# Patient Record
Sex: Female | Born: 1985 | Race: White | Hispanic: No | Smoking: Never smoker
Health system: Southern US, Community
[De-identification: ages and names within clinical notes are randomized; demographics above are authoritative.]

## PROBLEM LIST (undated history)

## (undated) ENCOUNTER — Inpatient Hospital Stay (HOSPITAL_COMMUNITY): Payer: Self-pay

## (undated) DIAGNOSIS — J45909 Unspecified asthma, uncomplicated: Secondary | ICD-10-CM

## (undated) HISTORY — PX: CERVIX SURGERY: SHX593

## (undated) HISTORY — PX: DILATION AND CURETTAGE OF UTERUS: SHX78

## (undated) HISTORY — PX: TUBAL LIGATION: SHX77

---

## 2013-07-23 ENCOUNTER — Encounter (HOSPITAL_COMMUNITY): Payer: Self-pay | Admitting: Emergency Medicine

## 2013-07-23 ENCOUNTER — Emergency Department (HOSPITAL_COMMUNITY): Payer: Medicaid Other

## 2013-07-23 ENCOUNTER — Emergency Department (HOSPITAL_COMMUNITY)
Admission: EM | Admit: 2013-07-23 | Discharge: 2013-07-23 | Disposition: A | Discharge status: Home / Self Care | Payer: Medicaid Other | Attending: Emergency Medicine | Admitting: Emergency Medicine

## 2013-07-23 DIAGNOSIS — O9989 Other specified diseases and conditions complicating pregnancy, childbirth and the puerperium: Secondary | ICD-10-CM | POA: Insufficient documentation

## 2013-07-23 DIAGNOSIS — Z3490 Encounter for supervision of normal pregnancy, unspecified, unspecified trimester: Secondary | ICD-10-CM

## 2013-07-23 DIAGNOSIS — R109 Unspecified abdominal pain: Secondary | ICD-10-CM | POA: Insufficient documentation

## 2013-07-23 DIAGNOSIS — J45909 Unspecified asthma, uncomplicated: Secondary | ICD-10-CM | POA: Insufficient documentation

## 2013-07-23 DIAGNOSIS — R110 Nausea: Secondary | ICD-10-CM | POA: Insufficient documentation

## 2013-07-23 DIAGNOSIS — Z79899 Other long term (current) drug therapy: Secondary | ICD-10-CM | POA: Insufficient documentation

## 2013-07-23 HISTORY — DX: Unspecified asthma, uncomplicated: J45.909

## 2013-07-23 LAB — BASIC METABOLIC PANEL
BUN: 11 mg/dL (ref 6–23)
CHLORIDE: 101 meq/L (ref 96–112)
CO2: 22 mEq/L (ref 19–32)
CREATININE: 0.58 mg/dL (ref 0.50–1.10)
Calcium: 9.5 mg/dL (ref 8.4–10.5)
Glucose, Bld: 94 mg/dL (ref 70–99)
Potassium: 3.7 mEq/L (ref 3.7–5.3)
Sodium: 136 mEq/L — ABNORMAL LOW (ref 137–147)

## 2013-07-23 LAB — POC URINE PREG, ED: Preg Test, Ur: POSITIVE — AB

## 2013-07-23 LAB — CBC WITH DIFFERENTIAL/PLATELET
BASOS PCT: 0 % (ref 0–1)
Basophils Absolute: 0 10*3/uL (ref 0.0–0.1)
EOS ABS: 0.1 10*3/uL (ref 0.0–0.7)
Eosinophils Relative: 1 % (ref 0–5)
HEMATOCRIT: 37.2 % (ref 36.0–46.0)
Hemoglobin: 12.8 g/dL (ref 12.0–15.0)
LYMPHS ABS: 2.7 10*3/uL (ref 0.7–4.0)
Lymphocytes Relative: 29 % (ref 12–46)
MCH: 29.6 pg (ref 26.0–34.0)
MCHC: 34.4 g/dL (ref 30.0–36.0)
MCV: 85.9 fL (ref 78.0–100.0)
MONO ABS: 0.6 10*3/uL (ref 0.1–1.0)
Monocytes Relative: 6 % (ref 3–12)
Neutro Abs: 5.8 10*3/uL (ref 1.7–7.7)
Neutrophils Relative %: 63 % (ref 43–77)
Platelets: 177 10*3/uL (ref 150–400)
RBC: 4.33 MIL/uL (ref 3.87–5.11)
RDW: 12.4 % (ref 11.5–15.5)
WBC: 9.2 10*3/uL (ref 4.0–10.5)

## 2013-07-23 LAB — HCG, QUANTITATIVE, PREGNANCY: hCG, Beta Chain, Quant, S: 45127 m[IU]/mL — ABNORMAL HIGH (ref <5)

## 2013-07-23 MED ORDER — PROMETHAZINE HCL 25 MG PO TABS: 25.0000 mg | ORAL_TABLET | Freq: Four times a day (QID) | ORAL | Status: DC | PRN

## 2013-07-23 MED ORDER — ONDANSETRON 8 MG PO TBDP
8.0000 mg | ORAL_TABLET | Freq: Once | ORAL | Status: AC
  Administered 2013-07-23: 8 mg via ORAL
  Filled 2013-07-23: qty 1

## 2013-07-23 NOTE — ED Provider Notes (Signed)
CSN: [id number]     Arrival date & time 07/23/13  1644 History   First MD Initiated Contact with Patient 07/23/13 1827     Chief Complaint  Patient presents with  . Nausea  . Abdominal Pain     (Consider location/radiation/quality/duration/timing/severity/associated sxs/prior Treatment) HPI..... G6 P3 Ab2 last menstrual period February 17 presents with intermittent abdominal pain for 2 days.  No vaginal discharge or bleeding. No fever, sweats, chills, dysuria. Severity is mild. No radiation of pain. Home pregnancy test positive  Past Medical History  Diagnosis Date  . Asthma    Past Surgical History  Procedure Laterality Date  . Cervix surgery     History reviewed. No pertinent family history. History  Substance Use Topics  . Smoking status: Never Smoker   . Smokeless tobacco: Never Used  . Alcohol Use: Yes     Comment: social   OB History   Grav Para Term Preterm Abortions TAB SAB Ect Mult Living                 Review of Systems  All other systems reviewed and are negative.      Allergies  Sulfate  Home Medications   Current Outpatient Rx  Name  Route  Sig  Dispense  Refill  . albuterol (PROVENTIL HFA;VENTOLIN HFA) 108 (90 BASE) MCG/ACT inhaler   Inhalation   Inhale 1 puff into the lungs every 6 (six) hours as needed for wheezing or shortness of breath.         . Multiple Vitamin (MULTIVITAMIN WITH MINERALS) TABS tablet   Oral   Take 1 tablet by mouth daily.         . promethazine (PHENERGAN) 25 MG tablet   Oral   Take 25 mg by mouth every 6 (six) hours as needed for nausea or vomiting.          BP 100/60  Pulse 69  Temp(Src) 98.1 F (36.7 C) (Oral)  Resp 18  SpO2 99%  LMP 06/04/2013 Physical Exam  Nursing note and vitals reviewed. Constitutional: She is oriented to person, place, and time. She appears well-developed and well-nourished.  HENT:  Head: Normocephalic and atraumatic.  Eyes: Conjunctivae and EOM are normal. Pupils are equal,  round, and reactive to light.  Neck: Normal range of motion. Neck supple.  Cardiovascular: Normal rate, regular rhythm and normal heart sounds.   Pulmonary/Chest: Effort normal and breath sounds normal.  Abdominal: Soft. Bowel sounds are normal.  Nontender  Musculoskeletal: Normal range of motion.  Neurological: She is alert and oriented to person, place, and time.  Skin: Skin is warm and dry.  Psychiatric: She has a normal mood and affect. Her behavior is normal.    ED Course  Procedures (including critical care time) Labs Review Labs Reviewed  BASIC METABOLIC PANEL - Abnormal; Notable for the following:    Sodium 136 (*)    All other components within normal limits  HCG, QUANTITATIVE, PREGNANCY - Abnormal; Notable for the following:    hCG, Beta Chain, Quant, [place] [id number] (*)    All other components within normal limits  POC URINE PREG, ED - Abnormal; Notable for the following:    Preg Test, Ur POSITIVE (*)    All other components within normal limits  CBC WITH DIFFERENTIAL   Imaging Review [place] Ob Comp Less 14 Wks  07/23/2013   CLINICAL DATA:  Pregnant patient with pelvic pain.  EXAM: OBSTETRIC <14 WK US AND TRANSVAGINAL OB US  TECHNIQUE: Both  transabdominal and transvaginal ultrasound examinations were performed for complete evaluation of the gestation as well as the maternal uterus, adnexal regions, and pelvic cul-de-sac. Transvaginal technique was performed to assess early pregnancy.  COMPARISON:  None.  FINDINGS: Intrauterine gestational sac: Visualized/normal in shape.  Yolk sac:  Visualized.  Embryo:  Visualized.  Cardiac Activity: Detected.  Heart Rate:  131 bpm  CRL:   1.0  mm   7 w 0 d                  [place] EDC: 03/11/2014.  Maternal uterus/adnexae: Unremarkable. Tiny amount of fluid about the left adnexa noted.  IMPRESSION: Single living intrauterine pregnancy without complicating feature. No acute abnormality.   Electronically Signed   By: Drusilla Kanner M.D.   On: 07/23/2013 21:03    US Ob Transvaginal  07/23/2013   CLINICAL DATA:  Pregnant patient with pelvic pain.  EXAM: OBSTETRIC <14 WK [place] AND TRANSVAGINAL OB US  TECHNIQUE: Both transabdominal and transvaginal ultrasound examinations were performed for complete evaluation of the gestation as well as the maternal uterus, adnexal regions, and pelvic cul-de-sac. Transvaginal technique was performed to assess early pregnancy.  COMPARISON:  None.  FINDINGS: Intrauterine gestational sac: Visualized/normal in shape.  Yolk sac:  Visualized.  Embryo:  Visualized.  Cardiac Activity: Detected.  Heart Rate:  131 bpm  CRL:   1.0  mm   7 w 0 d                  [place] EDC: 03/11/2014.  Maternal uterus/adnexae: Unremarkable. Tiny amount of fluid about the left adnexa noted.  IMPRESSION: Single living intrauterine pregnancy without complicating feature. No acute abnormality.   Electronically Signed   By: Drusilla Kanner M.D.   On: 07/23/2013 21:03     EKG Interpretation None      MDM   Final diagnoses:  Pregnancy   Ultrasound shows a single living intrauterine uterine pregnancy without complications.  No dates assigned. Results discussed with patient. She has obstetrical followup    Donnetta Hutching, MD 07/23/13 2311

## 2013-07-23 NOTE — Discharge Instructions (Signed)
Ultrasound shows a viable pregnancy. Tylenol for pain. Followup your obstetrician this week.

## 2013-07-23 NOTE — ED Notes (Signed)
Pt sts that she is having abd pain & nausea for several days. Pt describes pain as sharp then dull. Pt also has had some vaginal discharge during this same time. Pt has a cervical surgery in August and was told she would not be able to get pregnant and if so then she would be high risk for miscarriage. Pt thinks she might [redacted] week pregnant due to her LMP was 06/04/2013.

## 2013-07-23 NOTE — ED Notes (Signed)
Pregnant [redacted] weeks/ HX of miscarriages stomach cramping

## 2013-07-23 NOTE — Progress Notes (Signed)
   CARE MANAGEMENT ED NOTE 07/23/2013  Patient:  Celli,Carole   Account Number:  [id number]  Date Initiated:  07/23/2013  Documentation initiated by:  Radford PaxFERRERO,Jaxen Samples  Subjective/Objective Assessment:   Patient presents to ED with abdominla pian n/v     Subjective/Objective Assessment Detail:     Action/Plan:   Action/Plan Detail:   Anticipated DC Date:       Status Recommendation to Physician:   Result of Recommendation:    Other ED Services  Consult Working Plan    DC Planning Services  Other  PCP issues    Choice offered to / List presented to:            Status of service:  Completed, signed off  ED Comments:   ED Comments Detail:  Patient confirms she does not have a pcp.  [facility] of [facility] who [place].  Patient thankful for resources.  No further EDCM needs at this time.

## 2013-07-28 ENCOUNTER — Encounter (HOSPITAL_COMMUNITY): Payer: Self-pay | Admitting: *Deleted

## 2013-07-28 ENCOUNTER — Inpatient Hospital Stay (HOSPITAL_COMMUNITY)
Admission: AD | Admit: 2013-07-28 | Discharge: 2013-07-28 | Disposition: A | Discharge status: Home / Self Care | Payer: Medicaid Other | Source: Ambulatory Visit | Attending: Obstetrics & Gynecology | Admitting: Obstetrics & Gynecology

## 2013-07-28 DIAGNOSIS — O219 Vomiting of pregnancy, unspecified: Secondary | ICD-10-CM

## 2013-07-28 DIAGNOSIS — O210 Mild hyperemesis gravidarum: Secondary | ICD-10-CM | POA: Insufficient documentation

## 2013-07-28 LAB — URINE MICROSCOPIC-ADD ON

## 2013-07-28 LAB — URINALYSIS, ROUTINE W REFLEX MICROSCOPIC
BILIRUBIN URINE: NEGATIVE
GLUCOSE, UA: NEGATIVE mg/dL
Ketones, ur: 15 mg/dL — AB
Leukocytes, UA: NEGATIVE
Nitrite: NEGATIVE
Protein, ur: NEGATIVE mg/dL
SPECIFIC GRAVITY, URINE: 1.02 (ref 1.005–1.030)
UROBILINOGEN UA: 0.2 mg/dL (ref 0.0–1.0)
pH: 6 (ref 5.0–8.0)

## 2013-07-28 MED ORDER — PROMETHAZINE HCL 12.5 MG RE SUPP: 12.5000 mg | Freq: Four times a day (QID) | RECTAL | Status: DC | PRN

## 2013-07-28 MED ORDER — ACETAMINOPHEN 500 MG PO TABS: 1000.0000 mg | ORAL_TABLET | Freq: Once | ORAL | Status: DC

## 2013-07-28 MED ORDER — ONDANSETRON 4 MG PO TBDP
4.0000 mg | ORAL_TABLET | Freq: Once | ORAL | Status: AC
  Administered 2013-07-28: 4 mg via ORAL
  Filled 2013-07-28: qty 1

## 2013-07-28 MED ORDER — DOXYLAMINE SUCCINATE (SLEEP) 25 MG PO TABS: 25.0000 mg | ORAL_TABLET | Freq: Every evening | ORAL | Status: DC | PRN

## 2013-07-28 MED ORDER — PROMETHAZINE HCL 25 MG/ML IJ SOLN
25.0000 mg | Freq: Once | INTRAVENOUS | Status: AC
  Administered 2013-07-28: 25 mg via INTRAVENOUS
  Filled 2013-07-28: qty 1

## 2013-07-28 NOTE — Discharge Instructions (Signed)
Hyperemesis Gravidarum Diet Hyperemesis gravidarum is a severe form of morning sickness. It is characterized by frequent and severe vomiting. It happens during the first trimester of pregnancy. It may be caused by the rapid hormone changes that happen during pregnancy. It is associated with a 5% weight loss of pre-pregnancy weight. The hyperemesis diet may be used to lessen symptoms of nausea and vomiting. EATING GUIDELINES  Eat 5 to 6 small meals daily instead of 3 large meals.  Avoid foods with strong smells.  Avoid drinking 30 minutes before and after meals.  Avoid fried or high-fat foods, such as butter and cream sauces.  Starchy foods are usually well-tolerated, such as cereal, toast, bread, potatoes, pasta, rice, and pretzels.  Eat crackers before you get out of bed in the morning.  Avoid spicy foods.  Ginger may help with nausea. Add  tsp ginger to hot tea or choose ginger tea.  Continue to take your prenatal vitamins as directed by your caregiver. SAMPLE MEAL PLAN Breakfast    cup oatmeal  1 slice toast  1 tsp heart-healthy margarine  1 tsp jelly  1 scrambled egg Midmorning Snack   1 cup low-fat yogurt Lunch   Plain ham sandwich  Carrot or celery sticks  1 small apple  3 graham crackers Midafternoon Snack   Cheese and crackers Dinner  4 oz pork tenderloin  1 small baked potato  1 tsp margarine   cup broccoli   cup grapes Evening Snack  1 cup pudding Document Released: 01/30/2007 Document Revised: 06/27/2011 Document Reviewed: 09/04/2012 ExitCare Patient Information 2014 [place].  Morning Sickness Morning sickness is when you feel sick to your stomach (nauseous) during pregnancy. This nauseous feeling may or may not come with vomiting. It often occurs in the morning but can be a problem any time of day. Morning sickness is most common during the first trimester, but it may continue throughout pregnancy. While morning sickness is  unpleasant, it is usually harmless unless you develop severe and continual vomiting (hyperemesis gravidarum). This condition requires more intense treatment.  CAUSES  The cause of morning sickness is not completely known but seems to be related to normal hormonal changes that occur in pregnancy. RISK FACTORS You are at greater risk if you:  Experienced nausea or vomiting before your pregnancy.  Had morning sickness during a previous pregnancy.  Are pregnant with more than one baby, such as twins. TREATMENT  Do not use any medicines (prescription, over-the-counter, or herbal) for morning sickness without first talking to your health care provider. Your health care provider may prescribe or recommend:  Vitamin B6 supplements.  Anti-nausea medicines.  The herbal medicine ginger. HOME CARE INSTRUCTIONS   Only take over-the-counter or prescription medicines as directed by your health care provider.  Taking multivitamins before getting pregnant can prevent or decrease the severity of morning sickness in most women.   Eat a piece of dry toast or unsalted crackers before getting out of bed in the morning.   Eat five or six small meals a day.   Eat dry and bland foods (rice, baked potato). Foods high in carbohydrates are often helpful.  Do not drink liquids with your meals. Drink liquids between meals.   Avoid greasy, fatty, and spicy foods.   Get someone to cook for you if the smell of any food causes nausea and vomiting.   If you feel nauseous after taking prenatal vitamins, take the vitamins at night or with a snack.  Snack on protein foods (  nuts, yogurt, cheese) between meals if you are hungry.   Eat unsweetened gelatins for desserts.   Wearing an acupressure wristband (worn for sea sickness) may be helpful.   Acupuncture may be helpful.   Do not smoke.   Get a humidifier to keep the air in your house free of odors.   Get plenty of fresh air. SEEK MEDICAL  CARE IF:   Your home remedies are not working, and you need medicine.  You feel dizzy or lightheaded.  You are losing weight. SEEK IMMEDIATE MEDICAL CARE IF:   You have persistent and uncontrolled nausea and vomiting.  You pass out (faint). Document Released: 05/26/2006 Document Revised: 12/05/2012 Document Reviewed: 09/19/2012 [facility] Patient Information 2014 [place], [place]. Prenatal Care [facility]    [facility]  [phone]     Phone: [phone]          [facility]                      [facility]  @[place]     Phone: [phone]  Phone: [phone]         [facility]     Phone: [phone]  Phone: [phone]           [facility] [place]                hone: [phone]  Phone: [phone]         [facility] [address] Francoise Ceo      Phone: [phone]  Phone: [phone]         [facility] Dept.                Phone: [phone]  [facility]   [address] [place])          Phone: [phone] [facility]   Phone: [phone]

## 2013-07-28 NOTE — MAU Note (Signed)
Patient presents with complaints of N/V/D X 4 days.

## 2013-07-28 NOTE — MAU Provider Note (Signed)
Chief Complaint: Emesis, Nausea and Diarrhea   First Provider Initiated Contact with Patient 07/28/13 1423     SUBJECTIVE HPI: Brandi Ayala is a 28 y.o. [id number] at [redacted]w[redacted]d who presents with onset N/V 1 wk ago and retained no food or fluids over past 24 hrs. She was seen at [facility] ED 5 days ago and had ultrasound showing a viable [redacted] week gestation, normal CBC and electrolytes. Was given Phenergan but unable to retain pills. Feels weak and tingly.    Past Medical History  Diagnosis Date  . Asthma    OB History  Gravida Para Term Preterm AB SAB TAB Ectopic Multiple Living  7 3   3 2 1   3     # Outcome Date GA Lbr Len/2nd Weight Sex Delivery Anes PTL Lv  7 CUR           6 TAB           5 SAB           4 SAB           3 PAR           2 PAR           1 PAR              Past Surgical History  Procedure Laterality Date  . Cervix surgery     History   Social History  . Marital Status: Single    Spouse Name: N/A    Number of Children: N/A  . Years of Education: N/A   Occupational History  . Not on file.   Social History Main Topics  . Smoking status: Never Smoker   . Smokeless tobacco: Never Used  . Alcohol Use: Yes     Comment: social  . Drug Use: No  . Sexual Activity: Yes    Birth Control/ Protection: None   Other Topics Concern  . Not on file   Social History Narrative  . No narrative on file   No current facility-administered medications on file prior to encounter.   Current Outpatient Prescriptions on File Prior to Encounter  Medication Sig Dispense Refill  . albuterol (PROVENTIL HFA;VENTOLIN HFA) 108 (90 BASE) MCG/ACT inhaler Inhale 1 puff into the lungs every 6 (six) hours as needed for wheezing or shortness of breath.      . Multiple Vitamin (MULTIVITAMIN WITH MINERALS) TABS tablet Take 1 tablet by mouth daily.      . promethazine (PHENERGAN) 25 MG tablet Take 25 mg by mouth every 6 (six) hours as needed for nausea or vomiting.      . promethazine  (PHENERGAN) 25 MG tablet Take 1 tablet (25 mg total) by mouth every 6 (six) hours as needed for nausea.  20 tablet  0   Allergies  Allergen Reactions  . Sulfate Swelling and Rash    ROS: Pertinent items in HPI  OBJECTIVE Blood pressure 90/49, pulse 56, temperature 98 F (36.7 C), temperature source Oral, resp. rate 20, height 5\' 6"  (1.676 m), weight 68.947 kg (152 lb), last menstrual period 06/04/2013. GENERAL: Well-developed, well-nourished female in no acute distress; appears listless HEENT: Normocephalic HEART: normal rate RESP: normal effort ABDOMEN: Soft, non-tender EXTREMITIES: Nontender, no edema NEURO: Alert and oriented  LAB RESULTS No results found for this or any previous visit (from the past 24 hour(s)).  IMAGING US Ob Comp Less 14 Wks  07/23/2013   CLINICAL DATA:  Pregnant patient with pelvic pain.  EXAM:  OBSTETRIC <14 WK US AND TRANSVAGINAL OB US  TECHNIQUE: Both transabdominal and transvaginal ultrasound examinations were performed for complete evaluation of the gestation as well as the maternal uterus, adnexal regions, and pelvic cul-de-sac. Transvaginal technique was performed to assess early pregnancy.  COMPARISON:  None.  FINDINGS: Intrauterine gestational sac: Visualized/normal in shape.  Yolk sac:  Visualized.  Embryo:  Visualized.  Cardiac Activity: Detected.  Heart Rate:  131 bpm  CRL:   1.0  mm   7 w 0 d                  US EDC: 03/11/2014.  Maternal uterus/adnexae: Unremarkable. Tiny amount of fluid about the left adnexa noted.  IMPRESSION: Single living intrauterine pregnancy without complicating feature. No acute abnormality.   Electronically Signed   By: Drusilla Kannerhomas  Dalessio M.D.   On: 07/23/2013 21:03   [place] Ob Transvaginal  07/23/2013   CLINICAL DATA:  Pregnant patient with pelvic pain.  EXAM: OBSTETRIC <14 WK US AND TRANSVAGINAL OB US  TECHNIQUE: Both transabdominal and transvaginal ultrasound examinations were performed for complete evaluation of the gestation as  well as the maternal uterus, adnexal regions, and pelvic cul-de-sac. Transvaginal technique was performed to assess early pregnancy.  COMPARISON:  None.  FINDINGS: Intrauterine gestational sac: Visualized/normal in shape.  Yolk sac:  Visualized.  Embryo:  Visualized.  Cardiac Activity: Detected.  Heart Rate:  131 bpm  CRL:   1.0  mm   7 w 0 d                  US EDC: 03/11/2014.  Maternal uterus/adnexae: Unremarkable. Tiny amount of fluid about the left adnexa noted.  IMPRESSION: Single living intrauterine pregnancy without complicating feature. No acute abnormality.   Electronically Signed   By: Drusilla Kannerhomas  Dalessio M.D.   On: 07/23/2013 21:03    MAU COURSE IV LR with Phenergan 25 mg; 1620: IV infusing and tolerating sips of water but still unable to void. 1710: Feeling better and retaining fluids  ASSESSMENT 1. Nausea and vomiting in pregnancy prior to [redacted] weeks gestation   [id number] at 5839w5d  PLAN Discharge home AVS instructions on N/V in pregnancy   Medication List         albuterol 108 (90 BASE) MCG/ACT inhaler  Commonly known as:  PROVENTIL HFA;VENTOLIN HFA  Inhale 1 puff into the lungs every 6 (six) hours as needed for wheezing or shortness of breath.     bisacodyl 5 MG EC tablet  Commonly known as:  DULCOLAX  Take 5 mg by mouth once.     doxylamine (Sleep) 25 MG tablet  Commonly known as:  UNISOM  Take 1 tablet (25 mg total) by mouth at bedtime as needed.     multivitamin with minerals Tabs tablet  Take 1 tablet by mouth daily.     promethazine 25 MG tablet  Commonly known as:  PHENERGAN  Take 25 mg by mouth every 6 (six) hours as needed for nausea or vomiting.     promethazine 12.5 MG suppository  Commonly known as:  PHENERGAN  Place 1 suppository (12.5 mg total) rectally every 6 (six) hours as needed for nausea or vomiting.       Follow-up Information   [facility] [place]   Contact information:   [address] [place]  [phone]      Please follow up. (See list of prenatal care providers)       Schedule  an appointment as soon as possible for a visit to follow up.      Schedule an appointment as soon as possible for a visit with [facility].   Contact information:   [address]  [address] [place] [address] [phone]        Danae Orleans, CNM 07/28/2013  2:24 PM

## 2013-08-06 ENCOUNTER — Inpatient Hospital Stay (HOSPITAL_COMMUNITY): Payer: Medicaid Other

## 2013-08-06 ENCOUNTER — Encounter (HOSPITAL_COMMUNITY): Payer: Self-pay | Admitting: General Practice

## 2013-08-06 ENCOUNTER — Inpatient Hospital Stay (HOSPITAL_COMMUNITY)
Admission: AD | Admit: 2013-08-06 | Discharge: 2013-08-06 | Disposition: A | Discharge status: Home / Self Care | Payer: Medicaid Other | Source: Ambulatory Visit | Attending: Obstetrics & Gynecology | Admitting: Obstetrics & Gynecology

## 2013-08-06 DIAGNOSIS — O209 Hemorrhage in early pregnancy, unspecified: Secondary | ICD-10-CM | POA: Insufficient documentation

## 2013-08-06 DIAGNOSIS — N949 Unspecified condition associated with female genital organs and menstrual cycle: Secondary | ICD-10-CM | POA: Insufficient documentation

## 2013-08-06 DIAGNOSIS — R109 Unspecified abdominal pain: Secondary | ICD-10-CM | POA: Insufficient documentation

## 2013-08-06 DIAGNOSIS — O4690 Antepartum hemorrhage, unspecified, unspecified trimester: Secondary | ICD-10-CM

## 2013-08-06 DIAGNOSIS — O418X10 Other specified disorders of amniotic fluid and membranes, first trimester, not applicable or unspecified: Secondary | ICD-10-CM

## 2013-08-06 DIAGNOSIS — O468X1 Other antepartum hemorrhage, first trimester: Secondary | ICD-10-CM

## 2013-08-06 DIAGNOSIS — O208 Other hemorrhage in early pregnancy: Secondary | ICD-10-CM

## 2013-08-06 LAB — URINE MICROSCOPIC-ADD ON

## 2013-08-06 LAB — WET PREP, GENITAL
CLUE CELLS WET PREP: NONE SEEN
Trich, Wet Prep: NONE SEEN
Yeast Wet Prep HPF POC: NONE SEEN

## 2013-08-06 LAB — CBC
HCT: 35.6 % — ABNORMAL LOW (ref 36.0–46.0)
HEMOGLOBIN: 12.5 g/dL (ref 12.0–15.0)
MCH: 29.9 pg (ref 26.0–34.0)
MCHC: 35.1 g/dL (ref 30.0–36.0)
MCV: 85.2 fL (ref 78.0–100.0)
Platelets: 161 10*3/uL (ref 150–400)
RBC: 4.18 MIL/uL (ref 3.87–5.11)
RDW: 12.3 % (ref 11.5–15.5)
WBC: 7.2 10*3/uL (ref 4.0–10.5)

## 2013-08-06 LAB — ABO/RH: ABO/RH(D): B POS

## 2013-08-06 LAB — URINALYSIS, ROUTINE W REFLEX MICROSCOPIC
BILIRUBIN URINE: NEGATIVE
Glucose, UA: NEGATIVE mg/dL
Ketones, ur: NEGATIVE mg/dL
LEUKOCYTES UA: NEGATIVE
NITRITE: NEGATIVE
PH: 8 (ref 5.0–8.0)
PROTEIN: NEGATIVE mg/dL
Specific Gravity, Urine: 1.015 (ref 1.005–1.030)
Urobilinogen, UA: 0.2 mg/dL (ref 0.0–1.0)

## 2013-08-06 NOTE — Discharge Instructions (Signed)
Pelvic Rest Pelvic rest is sometimes recommended for women when:   The placenta is partially or completely covering the opening of the cervix (placenta previa).  There is bleeding between the uterine wall and the amniotic sac in the first trimester (subchorionic hemorrhage).  The cervix begins to open without labor starting (incompetent cervix, cervical insufficiency).  The labor is too early (preterm labor). HOME CARE INSTRUCTIONS  Do not have sexual intercourse, stimulation, or an orgasm.  Do not use tampons, douche, or put anything in the vagina.  Do not lift anything over 10 pounds (4.5 kg).  Avoid strenuous activity or straining your pelvic muscles. SEEK MEDICAL CARE IF:  You have any vaginal bleeding during pregnancy. Treat this as a potential emergency.  You have cramping pain felt low in the stomach (stronger than menstrual cramps).  You notice vaginal discharge (watery, mucus, or bloody).  You have a low, dull backache.  There are regular contractions or uterine tightening. SEEK IMMEDIATE MEDICAL CARE IF: You have vaginal bleeding and have placenta previa.  Document Released: 07/30/2010 Document Revised: 06/27/2011 Document Reviewed: 07/30/2010 [facility] Patient Information 2014 [place], [place].  Vaginal Bleeding During Pregnancy A small amount of bleeding from the vagina can happen anytime during pregnancy. It usually stops on its own. However, some bleeding can be serious. Be sure to tell your doctor about all vaginal bleeding. HOME CARE  Get plenty of rest and sleep.  Stay in bed and only get up to go to the bathroom as told by your doctor.  Write down the number of pads you use each day. Note how soaked they are.  Do not use tampons. Do not clean the vagina with a stream of water (douche).  Do not have sex (intercourse) or put anything into your vagina. Have this approved by your doctor.  Save any tissue that comes from your vagina. Show it to your  doctor.  Only take medicine as told by your doctor.  Follow your doctor's advice about lifting, driving, and physical activity. GET HELP RIGHT AWAY IF:   You feel your baby move less or not at all.  You pass out (faint) while going to the bathroom.  You have more bleeding.  You start to have contractions.  You have severe cramps in your stomach, back, or belly (abdomen).  You are leaking fluid or have a gush of fluid from your vagina.  You become lightheaded or weak.  You have chills.  You have clumps of tissue or blood clots coming from your vagina.  You have a fever. MAKE SURE YOU:   Understand these instructions.  Will watch your condition.  Will get help right away if you are not doing well or get worse. Document Released: 01/12/2008 Document Revised: 03/21/2012 Document Reviewed: 01/23/2012 [facility] Patient Information 2014 [place], [place].

## 2013-08-06 NOTE — MAU Provider Note (Signed)
History     CSN: [id number]  Arrival date and time: 08/06/13 1535   First Provider Initiated Contact with Patient 08/06/13 1613      Chief Complaint  Patient presents with  . Vaginal Bleeding  . Abdominal Pain   HPI  Brandi Ayala is a 28 y.o. [id number] [redacted]w[redacted]d presenting for vaginal bleeding since 13:00 today. Patient reports that she was taking a nap at noon and woke up with feeling urinary urgency. She admits that she had some incontinence but became concerned that she saw blood in her urine. However, she went to school and began to have crampy abdominal/pelvic pain. When she went home, she noticed that she continued to bleed and decided to wear a pad. She continued to have crampy abdominal pain but then started having sharper intermittent pains. She presents to the MAU for evaluation of her pregnancy. ROS is as below. Of note, the patient has not had intercourse in the past month. She has had vaginal discharge x1 week which she describes as her "mucous plug".  OB History   Grav Para Term Preterm Abortions TAB SAB Ect Mult Living   7 3 3  0 3 1 2  0 0 3      Past Medical History  Diagnosis Date  . Asthma     Past Surgical History  Procedure Laterality Date  . Cervix surgery      History reviewed. No pertinent family history.  History  Substance Use Topics  . Smoking status: Never Smoker   . Smokeless tobacco: Never Used  . Alcohol Use: Yes     Comment: social    Allergies:  Allergies  Allergen Reactions  . Other Rash    Patient states that she is allergic to jalapeno peppers.  . Sulfa Antibiotics Swelling and Rash    Prescriptions prior to admission  Medication Sig Dispense Refill  . albuterol (PROVENTIL HFA;VENTOLIN HFA) 108 (90 BASE) MCG/ACT inhaler Inhale 1 puff into the lungs every 6 (six) hours as needed for wheezing or shortness of breath.      . Multiple Vitamin (MULTIVITAMIN WITH MINERALS) TABS tablet Take 1 tablet by mouth daily.      . promethazine  (PHENERGAN) 25 MG tablet Take 25 mg by mouth every 6 (six) hours as needed for nausea or vomiting.       US Ob Transvaginal  08/06/2013   CLINICAL DATA:  Pregnant patient with new onset vaginal bleeding.  EXAM: TRANSVAGINAL OB ULTRASOUND  TECHNIQUE: Transvaginal ultrasound was performed for complete evaluation of the gestation as well as the maternal uterus, adnexal regions, and pelvic cul-de-sac.  COMPARISON:  Ob ultrasound 07/23/2013.  FINDINGS: Intrauterine gestational sac: Visualized/normal in shape. A new, moderate subchorionic hemorrhage is identified measuring approximately 3.8 x 2.1 x 1.1 cm.  Yolk sac:  Visualized.  Embryo:  Visualized.  Cardiac Activity: Detected.  Heart Rate: 170 bpm  CRL:   2.59  mm   9 w 3 d                  [place] EDC: 03/08/2014  Maternal uterus/adnexae: Unremarkable.  IMPRESSION: Single living intrauterine pregnancy with a new moderate subchorionic hemorrhage.   Electronically Signed   By: Drusilla Kanner M.D.   On: 08/06/2013 17:40   Results for orders placed during the hospital encounter of 08/06/13 (from the past 48 hour(s))  URINALYSIS, ROUTINE W REFLEX MICROSCOPIC     Status: Abnormal   Collection Time    08/06/13  3:50 PM  Result Value Ref Range   Color, Urine YELLOW  YELLOW   APPearance CLOUDY (*) CLEAR   Specific Gravity, Urine 1.015  1.005 - 1.030   pH 8.0  5.0 - 8.0   Glucose, UA NEGATIVE  NEGATIVE mg/dL   Hgb urine dipstick SMALL (*) NEGATIVE   Bilirubin Urine NEGATIVE  NEGATIVE   Ketones, ur NEGATIVE  NEGATIVE mg/dL   Protein, ur NEGATIVE  NEGATIVE mg/dL   Urobilinogen, UA 0.2  0.0 - 1.0 mg/dL   Nitrite NEGATIVE  NEGATIVE   Leukocytes, UA NEGATIVE  NEGATIVE  URINE MICROSCOPIC-ADD ON     Status: Abnormal   Collection Time    08/06/13  3:50 PM      Result Value Ref Range   Squamous Epithelial / LPF FEW (*) RARE   WBC, UA 0-2  <3 WBC/hpf   Urine-Other AMORPHOUS URATES/PHOSPHATES    WET PREP, GENITAL     Status: Abnormal   Collection Time     08/06/13  4:32 PM      Result Value Ref Range   Yeast Wet Prep HPF POC NONE SEEN  NONE SEEN   Trich, Wet Prep NONE SEEN  NONE SEEN   Clue Cells Wet Prep HPF POC NONE SEEN  NONE SEEN   WBC, Wet Prep HPF POC FEW (*) NONE SEEN   Comment: MANY BACTERIA SEEN  CBC     Status: Abnormal   Collection Time    08/06/13  4:38 PM      Result Value Ref Range   WBC 7.2  4.0 - 10.5 K/uL   RBC 4.18  3.87 - 5.11 MIL/uL   Hemoglobin 12.5  12.0 - 15.0 g/dL   HCT 16.135.6 (*) 09.636.0 - 04.546.0 %   MCV 85.2  78.0 - 100.0 fL   MCH 29.9  26.0 - 34.0 pg   MCHC 35.1  30.0 - 36.0 g/dL   RDW 40.912.3  81.111.5 - 91.415.5 %   Platelets 161  150 - 400 K/uL  ABO/RH     Status: None   Collection Time    08/06/13  4:38 PM      Result Value Ref Range   ABO/RH(D) B POS     [place] Ob Transvaginal  08/06/2013   CLINICAL DATA:  Pregnant patient with new onset vaginal bleeding.  EXAM: TRANSVAGINAL OB ULTRASOUND  TECHNIQUE: Transvaginal ultrasound was performed for complete evaluation of the gestation as well as the maternal uterus, adnexal regions, and pelvic cul-de-sac.  COMPARISON:  Ob ultrasound 07/23/2013.  FINDINGS: Intrauterine gestational sac: Visualized/normal in shape. A new, moderate subchorionic hemorrhage is identified measuring approximately 3.8 x 2.1 x 1.1 cm.  Yolk sac:  Visualized.  Embryo:  Visualized.  Cardiac Activity: Detected.  Heart Rate: 170 bpm  CRL:   2.59  mm   9 w 3 d                  [place] EDC: 03/08/2014  Maternal uterus/adnexae: Unremarkable.  IMPRESSION: Single living intrauterine pregnancy with a new moderate subchorionic hemorrhage.   Electronically Signed   By: Drusilla Kannerhomas  Dalessio M.D.   On: 08/06/2013 17:40    Review of Systems  Constitutional: Negative for fever and chills.  HENT: Negative for hearing loss and sore throat.   Eyes: Negative for blurred vision.  Respiratory: Negative for cough and shortness of breath.   Cardiovascular: Negative for chest pain and leg swelling.  Gastrointestinal: Positive for nausea  (relieved with Phenergan), abdominal pain and diarrhea (x1 last  night). Negative for vomiting, constipation and blood in stool.  Genitourinary: Positive for urgency (x1 earlier today), hematuria (x1 in the afternoon) and flank pain (right-sided). Negative for dysuria and frequency.       Vaginal bleeding/spotting since 13:00  Musculoskeletal: Negative for joint pain.  Skin: Negative for rash.  Neurological: Negative for dizziness and headaches.  Endo/Heme/Allergies: Does not bruise/bleed easily.   Physical Exam   Blood pressure 103/71, pulse 86, temperature 98.5 F (36.9 C), temperature source Oral, resp. rate 16, height 5\' 4"  (1.626 m), weight 68.675 kg (151 lb 6.4 oz), last menstrual period 06/04/2013, SpO2 100.00%.  Physical Exam  Constitutional: She is oriented to person, place, and time. She appears well-developed and well-nourished. No distress.  HENT:  Head: Normocephalic and atraumatic.  Eyes: EOM are normal. Pupils are equal, round, and reactive to light. No scleral icterus.  Neck: Normal range of motion. No tracheal deviation present.  Cardiovascular: Normal rate, regular rhythm, normal heart sounds and intact distal pulses.  Exam reveals no gallop and no friction rub.   No murmur heard. Respiratory: Effort normal and breath sounds normal. No stridor. No respiratory distress. She has no wheezes. She has no rales.  GI: Soft. Bowel sounds are normal. She exhibits no distension. There is tenderness (mild left lower quadrant tenderness). There is no rebound and no guarding.  Genitourinary:    There is no rash, tenderness or lesion on the right labia. There is lesion (nevus) on the left labia. There is no rash or tenderness on the left labia. Cervix exhibits discharge (mixture of discharge and blood but no active hemorrhage). Cervix exhibits no motion tenderness and no friability. Right adnexum displays no mass, no tenderness and no fullness. Left adnexum displays no mass, no  tenderness and no fullness. No erythema, tenderness or bleeding around the vagina. No foreign body around the vagina. No signs of injury around the vagina. Vaginal discharge (mild amount of brown discharge externally) found.  Musculoskeletal: Normal range of motion. She exhibits no edema and no tenderness.  Neurological: She is alert and oriented to person, place, and time.  Skin: Skin is warm and dry. No rash noted. She is not diaphoretic. No erythema.  Psychiatric: She has a normal mood and affect.    MAU Course  Procedures  Results for orders placed during the hospital encounter of 08/06/13 (from the past 24 hour(s))  URINALYSIS, ROUTINE W REFLEX MICROSCOPIC     Status: Abnormal   Collection Time    08/06/13  3:50 PM      Result Value Ref Range   Color, Urine YELLOW  YELLOW   APPearance CLOUDY (*) CLEAR   Specific Gravity, Urine 1.015  1.005 - 1.030   pH 8.0  5.0 - 8.0   Glucose, UA NEGATIVE  NEGATIVE mg/dL   Hgb urine dipstick SMALL (*) NEGATIVE   Bilirubin Urine NEGATIVE  NEGATIVE   Ketones, ur NEGATIVE  NEGATIVE mg/dL   Protein, ur NEGATIVE  NEGATIVE mg/dL   Urobilinogen, UA 0.2  0.0 - 1.0 mg/dL   Nitrite NEGATIVE  NEGATIVE   Leukocytes, UA NEGATIVE  NEGATIVE  URINE MICROSCOPIC-ADD ON     Status: Abnormal   Collection Time    08/06/13  3:50 PM      Result Value Ref Range   Squamous Epithelial / LPF FEW (*) RARE   WBC, UA 0-2  <3 WBC/hpf   Urine-Other AMORPHOUS URATES/PHOSPHATES    WET PREP, GENITAL     Status: Abnormal  Collection Time    08/06/13  4:32 PM      Result Value Ref Range   Yeast Wet Prep HPF POC NONE SEEN  NONE SEEN   Trich, Wet Prep NONE SEEN  NONE SEEN   Clue Cells Wet Prep HPF POC NONE SEEN  NONE SEEN   WBC, Wet Prep HPF POC FEW (*) NONE SEEN  CBC     Status: Abnormal   Collection Time    08/06/13  4:38 PM      Result Value Ref Range   WBC 7.2  4.0 - 10.5 K/uL   RBC 4.18  3.87 - 5.11 MIL/uL   Hemoglobin 12.5  12.0 - 15.0 g/dL   HCT 16.135.6 (*) 09.636.0  - 46.0 %   MCV 85.2  78.0 - 100.0 fL   MCH 29.9  26.0 - 34.0 pg   MCHC 35.1  30.0 - 36.0 g/dL   RDW 04.512.3  40.911.5 - 81.115.5 %   Platelets 161  150 - 400 K/uL   MDM Brandi Ayala is a 28 y.o. [id number] presenting for vaginal bleeding and abdominal pain since 13:00 today. Must rule out fetal demise, consider STI.  transvaginal US - CBC, UA, ABO/Rh, GC Chlamydia, wet mount B positive blood type    Assessment and Plan   A: 7183w3d IUP on US  Vaginal bleeding in first trimester  Moderate subchorionic hemorrhage   P:  Discharge home in stable condition Pelvic rest Bleeding precautions [facility]; encouraged to keep this patient Return to MAU as needed, if symptoms worsen   Wallis BambergMario Mani 08/06/2013, 4:28 PM   Evaluation and management procedures were performed by the PA student under my supervision and collaboration. I have reviewed the note and chart, and I agree with the management and plan.  Iona HansenJennifer Irene Rasch, NP 08/06/2013 6:58 PM

## 2013-08-06 NOTE — MAU Note (Signed)
Patient states she has started spotting and having cramping. Has nausea, no vomiting and had diarrhea yesterday, not today.

## 2013-08-07 LAB — GC/CHLAMYDIA PROBE AMP
CT Probe RNA: NEGATIVE
GC PROBE AMP APTIMA: NEGATIVE

## 2013-08-08 NOTE — MAU Provider Note (Signed)
Attestation of Attending Supervision of Advanced Practitioner (PA/CNM/NP): Evaluation and management procedures were performed by the Advanced Practitioner under my supervision and collaboration.  I have reviewed the Advanced Practitioner's note and chart, and I agree with the management and plan.  Izick Gasbarro, MD, FACOG Attending Obstetrician & Gynecologist Faculty Practice, [facility]  

## 2013-08-23 LAB — OB RESULTS CONSOLE RPR: RPR: NONREACTIVE

## 2013-08-23 LAB — OB RESULTS CONSOLE ANTIBODY SCREEN: Antibody Screen: NEGATIVE

## 2013-08-23 LAB — OB RESULTS CONSOLE HEPATITIS B SURFACE ANTIGEN: HEP B S AG: NEGATIVE

## 2013-08-23 LAB — OB RESULTS CONSOLE GC/CHLAMYDIA
CHLAMYDIA, DNA PROBE: NEGATIVE
GC PROBE AMP, GENITAL: NEGATIVE

## 2013-08-23 LAB — OB RESULTS CONSOLE HIV ANTIBODY (ROUTINE TESTING): HIV: NONREACTIVE

## 2013-08-23 LAB — OB RESULTS CONSOLE RUBELLA ANTIBODY, IGM: Rubella: IMMUNE

## 2014-02-10 ENCOUNTER — Encounter (HOSPITAL_COMMUNITY): Payer: Self-pay | Admitting: *Deleted

## 2014-02-10 ENCOUNTER — Inpatient Hospital Stay (HOSPITAL_COMMUNITY)
Admission: AD | Admit: 2014-02-10 | Discharge: 2014-02-10 | Disposition: A | Discharge status: Home / Self Care | Payer: Medicaid Other | Source: Ambulatory Visit | Attending: Obstetrics and Gynecology | Admitting: Obstetrics and Gynecology

## 2014-02-10 DIAGNOSIS — O9989 Other specified diseases and conditions complicating pregnancy, childbirth and the puerperium: Secondary | ICD-10-CM | POA: Insufficient documentation

## 2014-02-10 DIAGNOSIS — Z3A36 36 weeks gestation of pregnancy: Secondary | ICD-10-CM | POA: Diagnosis not present

## 2014-02-10 DIAGNOSIS — N949 Unspecified condition associated with female genital organs and menstrual cycle: Secondary | ICD-10-CM | POA: Insufficient documentation

## 2014-02-10 DIAGNOSIS — O26893 Other specified pregnancy related conditions, third trimester: Secondary | ICD-10-CM

## 2014-02-10 DIAGNOSIS — O479 False labor, unspecified: Secondary | ICD-10-CM

## 2014-02-10 DIAGNOSIS — R102 Pelvic and perineal pain: Secondary | ICD-10-CM

## 2014-02-10 DIAGNOSIS — Z3A35 35 weeks gestation of pregnancy: Secondary | ICD-10-CM

## 2014-02-10 NOTE — MAU Note (Signed)
Brandi BourgeoisMarie Williams CNM at bedside to perform ultrasound and confirm vertex presentation.

## 2014-02-10 NOTE — MAU Note (Signed)
Urine in lab 

## 2014-02-10 NOTE — Discharge Instructions (Signed)
Braxton Hicks Contractions °Contractions of the uterus can occur throughout pregnancy. Contractions are not always a sign that you are in labor.  °WHAT ARE BRAXTON HICKS CONTRACTIONS?  °Contractions that occur before labor are called Braxton Hicks contractions, or false labor. Toward the end of pregnancy (32-34 weeks), these contractions can develop more often and may become more forceful. This is not true labor because these contractions do not result in opening (dilatation) and thinning of the cervix. They are sometimes difficult to tell apart from true labor because these contractions can be forceful and people have different pain tolerances. You should not feel embarrassed if you go to the hospital with false labor. Sometimes, the only way to tell if you are in true labor is for your health care provider to look for changes in the cervix. °If there are no prenatal problems or other health problems associated with the pregnancy, it is completely safe to be sent home with false labor and await the onset of true labor. °HOW CAN YOU TELL THE DIFFERENCE BETWEEN TRUE AND FALSE LABOR? °False Labor °· The contractions of false labor are usually shorter and not as hard as those of true labor.   °· The contractions are usually irregular.   °· The contractions are often felt in the front of the lower abdomen and in the groin.   °· The contractions may go away when you walk around or change positions while lying down.   °· The contractions get weaker and are shorter lasting as time goes on.   °· The contractions do not usually become progressively stronger, regular, and closer together as with true labor.   °True Labor °· Contractions in true labor last 30-70 seconds, become very regular, usually become more intense, and increase in frequency.   °· The contractions do not go away with walking.   °· The discomfort is usually felt in the top of the uterus and spreads to the lower abdomen and low back.   °· True labor can be  determined by your health care provider with an exam. This will show that the cervix is dilating and getting thinner.   °WHAT TO REMEMBER °· Keep up with your usual exercises and follow other instructions given by your health care provider.   °· Take medicines as directed by your health care provider.   °· Keep your regular prenatal appointments.   °· Eat and drink lightly if you think you are going into labor.   °· If Braxton Hicks contractions are making you uncomfortable:   °¨ Change your position from lying down or resting to walking, or from walking to resting.   °¨ Sit and rest in a tub of warm water.   °¨ Drink 2-3 glasses of water. Dehydration may cause these contractions.   °¨ Do slow and deep breathing several times an hour.   °WHEN SHOULD I SEEK IMMEDIATE MEDICAL CARE? °Seek immediate medical care if: °· Your contractions become stronger, more regular, and closer together.   °· You have fluid leaking or gushing from your vagina.   °· You have a fever.   °· You pass blood-tinged mucus.   °· You have vaginal bleeding.   °· You have continuous abdominal pain.   °· You have low back pain that you never had before.   °· You feel your baby's head pushing down and causing pelvic pressure.   °· Your baby is not moving as much as it used to.   °Document Released: 04/04/2005 Document Revised: 04/09/2013 Document Reviewed: 01/14/2013 °ExitCare® Patient Information ©2015 [employer]. This information is not intended to replace advice given to you by your health care   provider. Make sure you discuss any questions you have with your health care provider. Vaginal Delivery During delivery, your health care provider will help you give birth to your baby. During a vaginal delivery, you will work to push the baby out of your vagina. However, before you can push your baby out, a few things need to happen. The opening of your uterus (cervix) has to soften, thin out, and open up (dilate) all the way to 10 cm. Also, your  baby has to move down from the uterus into your vagina.  SIGNS OF LABOR  Your health care provider will first need to make sure you are in labor. Signs of labor include:   Passing what is called the mucous plug before labor begins. This is a small amount of blood-stained mucus.  Having regular, painful uterine contractions.   The time between contractions gets shorter.   The discomfort and pain gradually get more intense.  Contraction pains get worse when walking and do not go away when resting.   Your cervix becomes thinner (effacement) and dilates. BEFORE THE DELIVERY Once you are in labor and admitted into the hospital or care center, your health care provider may do the following:   Perform a complete physical exam.  Review any complications related to pregnancy or labor.  Check your blood pressure, pulse, temperature, and heart rate (vital signs).   Determine if, and when, the rupture of amniotic membranes occurred.  Do a vaginal exam (using a sterile glove and lubricant) to determine:   The position (presentation) of the baby. Is the baby's head presenting first (vertex) in the birth canal (vagina), or are the feet or buttocks first (breech)?   The level (station) of the baby's head within the birth canal.   The effacement and dilatation of the cervix.   An electronic fetal monitor is usually placed on your abdomen when you first arrive. This is used to monitor your contractions and the baby's heart rate.  When the monitor is on your abdomen (external fetal monitor), it can only pick up the frequency and length of your contractions. It cannot tell the strength of your contractions.  If it becomes necessary for your health care provider to know exactly how strong your contractions are or to see exactly what the baby's heart rate is doing, an internal monitor may be inserted into your vagina and uterus. Your health care provider will discuss the benefits and risks  of using an internal monitor and obtain your permission before inserting the device.  Continuous fetal monitoring may be needed if you have an epidural, are receiving certain medicines (such as oxytocin), or have pregnancy or labor complications.  An IV access tube may be placed into a vein in your arm to deliver fluids and medicines if necessary. THREE STAGES OF LABOR AND DELIVERY Normal labor and delivery is divided into three stages. First Stage This stage starts when you begin to contract regularly and your cervix begins to efface and dilate. It ends when your cervix is completely open (fully dilated). The first stage is the longest stage of labor and can last from 3 hours to 15 hours.  Several methods are available to help with labor pain. You and your health care provider will decide which option is best for you. Options include:   Opioid medicines. These are strong pain medicines that you can get through your IV tube or as a shot into your muscle. These medicines lessen pain but do not make  it go away completely.  Epidural. A medicine is given through a thin tube that is inserted in your back. The medicine numbs the lower part of your body and prevents any pain in that area.  Paracervical pain medicine. This is an injection of an anesthetic on each side of your cervix.   You may request natural childbirth, which does not involve the use of pain medicines or an epidural during labor and delivery. Instead, you will use other things, such as breathing exercises, to help cope with the pain. Second Stage The second stage of labor begins when your cervix is fully dilated at 10 cm. It continues until you push your baby down through the birth canal and the baby is born. This stage can take only minutes or several hours.  The location of your baby's head as it moves through the birth canal is reported as a number called a station. If the baby's head has not started its descent, the station is  described as being at minus 3 (-3). When your baby's head is at the zero station, it is at the middle of the birth canal and is engaged in the pelvis. The station of your baby helps indicate the progress of the second stage of labor.  When your baby is born, your health care provider may hold the baby with his or her head lowered to prevent amniotic fluid, mucus, and blood from getting into the baby's lungs. The baby's mouth and nose may be suctioned with a small bulb syringe to remove any additional fluid.  Your health care provider may then place the baby on your stomach. It is important to keep the baby from getting cold. To do this, the health care provider will dry the baby off, place the baby directly on your skin (with no blankets between you and the baby), and cover the baby with warm, dry blankets.   The umbilical cord is cut. Third Stage During the third stage of labor, your health care provider will deliver the placenta (afterbirth) and make sure your bleeding is under control. The delivery of the placenta usually takes about 5 minutes but can take up to 30 minutes. After the placenta is delivered, a medicine may be given either by IV or injection to help contract the uterus and control bleeding. If you are planning to breastfeed, you can try to do so now. After you deliver the placenta, your uterus should contract and get very firm. If your uterus does not remain firm, your health care provider will massage it. This is important because the contraction of the uterus helps cut off bleeding at the site where the placenta was attached to your uterus. If your uterus does not contract properly and stay firm, you may continue to bleed heavily. If there is a lot of bleeding, medicines may be given to contract the uterus and stop the bleeding.  Document Released: 01/12/2008 Document Revised: 08/19/2013 Document Reviewed: 09/23/2012 ExitCare Patient Information 2015 [employer]. This information  is not intended to replace advice given to you by your health care provider. Make sure you discuss any questions you have with your health care provider.  

## 2014-02-10 NOTE — MAU Note (Signed)
Brandi BourgeoisMarie Williams in to examine pt. No cervical change made, pt to be d/c to home.

## 2014-02-10 NOTE — MAU Note (Signed)
Pt presented today in MAU c/o lower abdominal pain that began this morning and became worse with walking. Pt denies leaking of fluid and bleeding. Pt states that baby is active.

## 2014-02-10 NOTE — MAU Provider Note (Signed)
History     CSN: [id number]  Arrival date and time: 02/10/14 1741   None     No chief complaint on file.  HPI This is a 28 y.o. female at 5726w6d who presents with c/o pelvic pain and lower abdominal pain when she walks. Does have intermittent contractions, not sure if the pain is related to them, but thinks the pain is only there when there is a contraction.  Denies leaking or bleeding  [place] this [place] and walked a lot. Other children delivered close to term with rapid labor on last one.   OB History   Grav Para Term Preterm Abortions TAB SAB Ect Mult Living   7 3 3  0 3 1 2  0 0 3      Past Medical History  Diagnosis Date  . Asthma     Past Surgical History  Procedure Laterality Date  . Cervix surgery      No family history on file.  History  Substance Use Topics  . Smoking status: Never Smoker   . Smokeless tobacco: Never Used  . Alcohol Use: Yes     Comment: social    Allergies:  Allergies  Allergen Reactions  . Other Rash    Patient states that she is allergic to jalapeno peppers.  . Sulfa Antibiotics Swelling and Rash    Prescriptions prior to admission  Medication Sig Dispense Refill  . albuterol (PROVENTIL HFA;VENTOLIN HFA) 108 (90 BASE) MCG/ACT inhaler Inhale 1 puff into the lungs every 6 (six) hours as needed for wheezing or shortness of breath.      . Multiple Vitamin (MULTIVITAMIN WITH MINERALS) TABS tablet Take 1 tablet by mouth daily.      . promethazine (PHENERGAN) 12.5 MG suppository Place 12.5 mg rectally every 6 (six) hours as needed for nausea or vomiting.        Review of Systems  Constitutional: Negative for fever, chills and malaise/fatigue.  Gastrointestinal: Positive for abdominal pain. Negative for nausea, vomiting, diarrhea and constipation.  Genitourinary: Negative for dysuria.  Neurological: Negative for headaches.   Physical Exam   Last menstrual period 06/04/2013.  Physical Exam  Constitutional:  She is oriented to person, place, and time. She appears well-developed and well-nourished.  HENT:  Head: Normocephalic.  Cardiovascular: Normal rate.   Respiratory: Effort normal.  GI: Soft. She exhibits no distension. There is no tenderness. There is no rebound and no guarding.  Genitourinary: Vagina normal and uterus normal. No vaginal discharge found.  Dilation: Closed Effacement (%): 50 Cervical Position: Posterior Station: Ballotable Presentation: Vertex Exam by:: Brandi DomMaria Miko Ayala CNM   Musculoskeletal: Normal range of motion.  Neurological: She is alert and oriented to person, place, and time.  Skin: Skin is warm and dry.  Psychiatric: She has a normal mood and affect.   FHR reactive UCs initially every 8-9 minutes, then just before discharge got closer to q 4 minutes Recheck of cervix showed no change MAU Course  Procedures  MDM No results found for this or any previous visit (from the past 72 hour(s)). Presenting part not clearly felt with exam Bedside US done showing vertex presentation with back going up right side.  Fetal breathing seen.  Assessment and Plan  A:  SIUP at 749w0d       Uterine irritability      No change in cervix  P:  Discussed with Dr Jackelyn KnifeMeisinger       Will discharge home  Instructed to push po fluids       Followup this week in office  [facility] Ayala,Brandi Fontaine 02/10/2014, 6:52 PM

## 2014-02-12 NOTE — Progress Notes (Signed)
FHT from 10-26 reviewed.  Reactive NST, irreg ctx.

## 2014-02-17 ENCOUNTER — Encounter (HOSPITAL_COMMUNITY): Payer: Self-pay | Admitting: *Deleted

## 2014-02-18 LAB — OB RESULTS CONSOLE GBS: GBS: NEGATIVE

## 2014-03-01 ENCOUNTER — Encounter (HOSPITAL_COMMUNITY): Payer: Self-pay | Admitting: *Deleted

## 2014-03-01 ENCOUNTER — Inpatient Hospital Stay (HOSPITAL_COMMUNITY): Payer: Medicaid Other | Admitting: Anesthesiology

## 2014-03-01 ENCOUNTER — Inpatient Hospital Stay (HOSPITAL_COMMUNITY)
Admission: AD | Admit: 2014-03-01 | Discharge: 2014-03-03 | DRG: 775 | Disposition: A | Discharge status: Home / Self Care | Payer: Medicaid Other | Source: Ambulatory Visit | Attending: Obstetrics and Gynecology | Admitting: Obstetrics and Gynecology

## 2014-03-01 DIAGNOSIS — Z3A38 38 weeks gestation of pregnancy: Secondary | ICD-10-CM | POA: Diagnosis present

## 2014-03-01 DIAGNOSIS — O4290 Premature rupture of membranes, unspecified as to length of time between rupture and onset of labor, unspecified weeks of gestation: Secondary | ICD-10-CM

## 2014-03-01 DIAGNOSIS — Z3483 Encounter for supervision of other normal pregnancy, third trimester: Secondary | ICD-10-CM | POA: Diagnosis present

## 2014-03-01 DIAGNOSIS — Z3490 Encounter for supervision of normal pregnancy, unspecified, unspecified trimester: Secondary | ICD-10-CM

## 2014-03-01 DIAGNOSIS — O4200 Premature rupture of membranes, onset of labor within 24 hours of rupture, unspecified weeks of gestation: Secondary | ICD-10-CM

## 2014-03-01 LAB — CBC
HCT: 35.6 % — ABNORMAL LOW (ref 36.0–46.0)
Hemoglobin: 11.5 g/dL — ABNORMAL LOW (ref 12.0–15.0)
MCH: 26.6 pg (ref 26.0–34.0)
MCHC: 32.3 g/dL (ref 30.0–36.0)
MCV: 82.2 fL (ref 78.0–100.0)
PLATELETS: 141 10*3/uL — AB (ref 150–400)
RBC: 4.33 MIL/uL (ref 3.87–5.11)
RDW: 13.6 % (ref 11.5–15.5)
WBC: 10.1 10*3/uL (ref 4.0–10.5)

## 2014-03-01 LAB — TYPE AND SCREEN
ABO/RH(D): B POS
ANTIBODY SCREEN: NEGATIVE

## 2014-03-01 LAB — RPR

## 2014-03-01 LAB — HIV ANTIBODY (ROUTINE TESTING W REFLEX): HIV 1&2 Ab, 4th Generation: NONREACTIVE

## 2014-03-01 MED ORDER — ONDANSETRON HCL 4 MG/2ML IJ SOLN
4.0000 mg | INTRAMUSCULAR | Status: DC | PRN
Start: 2014-03-01 — End: 2014-03-03

## 2014-03-01 MED ORDER — OXYTOCIN 40 UNITS IN LACTATED RINGERS INFUSION - SIMPLE MED
62.5000 mL/h | INTRAVENOUS | Status: DC
  Filled 2014-03-01: qty 1000

## 2014-03-01 MED ORDER — LIDOCAINE HCL (PF) 1 % IJ SOLN
30.0000 mL | INTRAMUSCULAR | Status: DC | PRN
  Filled 2014-03-01: qty 30

## 2014-03-01 MED ORDER — LACTATED RINGERS IV SOLN
INTRAVENOUS | Status: AC
  Administered 2014-03-01: 22:00:00 via INTRAVENOUS

## 2014-03-01 MED ORDER — LACTATED RINGERS IV SOLN
INTRAVENOUS | Status: DC
  Administered 2014-03-01 (×2): via INTRAVENOUS

## 2014-03-01 MED ORDER — PRENATAL MULTIVITAMIN CH
1.0000 | ORAL_TABLET | Freq: Every day | ORAL | Status: DC
  Administered 2014-03-02: 1 via ORAL
  Filled 2014-03-01: qty 1

## 2014-03-01 MED ORDER — SENNOSIDES-DOCUSATE SODIUM 8.6-50 MG PO TABS
2.0000 | ORAL_TABLET | ORAL | Status: DC
  Administered 2014-03-01 – 2014-03-03 (×2): 2 via ORAL
  Filled 2014-03-01 (×2): qty 2

## 2014-03-01 MED ORDER — PHENYLEPHRINE 40 MCG/ML (10ML) SYRINGE FOR IV PUSH (FOR BLOOD PRESSURE SUPPORT)
80.0000 ug | PREFILLED_SYRINGE | INTRAVENOUS | Status: DC | PRN
  Filled 2014-03-01: qty 2

## 2014-03-01 MED ORDER — OXYCODONE-ACETAMINOPHEN 5-325 MG PO TABS: 2.0000 | ORAL_TABLET | ORAL | Status: DC | PRN

## 2014-03-01 MED ORDER — SIMETHICONE 80 MG PO CHEW
80.0000 mg | CHEWABLE_TABLET | ORAL | Status: DC | PRN
Start: 2014-03-01 — End: 2014-03-03

## 2014-03-01 MED ORDER — FENTANYL 2.5 MCG/ML BUPIVACAINE 1/10 % EPIDURAL INFUSION (WH - ANES)
INTRAMUSCULAR | Status: DC | PRN
  Administered 2014-03-01: 14 mL/h via EPIDURAL

## 2014-03-01 MED ORDER — MEASLES, MUMPS & RUBELLA VAC ~~LOC~~ INJ
0.5000 mL | INJECTION | Freq: Once | SUBCUTANEOUS | Status: DC
  Filled 2014-03-01: qty 0.5

## 2014-03-01 MED ORDER — FLEET ENEMA 7-19 GM/118ML RE ENEM: 1.0000 | ENEMA | RECTAL | Status: DC | PRN

## 2014-03-01 MED ORDER — DIPHENHYDRAMINE HCL 50 MG/ML IJ SOLN: 12.5000 mg | INTRAMUSCULAR | Status: DC | PRN

## 2014-03-01 MED ORDER — LIDOCAINE HCL (PF) 1 % IJ SOLN
INTRAMUSCULAR | Status: DC | PRN
  Administered 2014-03-01 (×2): 4 mL

## 2014-03-01 MED ORDER — ZOLPIDEM TARTRATE 5 MG PO TABS: 5.0000 mg | ORAL_TABLET | Freq: Every evening | ORAL | Status: DC | PRN

## 2014-03-01 MED ORDER — LACTATED RINGERS IV SOLN: 500.0000 mL | Freq: Once | INTRAVENOUS | Status: DC

## 2014-03-01 MED ORDER — ACETAMINOPHEN 325 MG PO TABS: 650.0000 mg | ORAL_TABLET | ORAL | Status: DC | PRN

## 2014-03-01 MED ORDER — ONDANSETRON HCL 4 MG/2ML IJ SOLN: 4.0000 mg | Freq: Four times a day (QID) | INTRAMUSCULAR | Status: DC | PRN

## 2014-03-01 MED ORDER — TERBUTALINE SULFATE 1 MG/ML IJ SOLN: 0.2500 mg | Freq: Once | INTRAMUSCULAR | Status: DC | PRN

## 2014-03-01 MED ORDER — PHENYLEPHRINE 40 MCG/ML (10ML) SYRINGE FOR IV PUSH (FOR BLOOD PRESSURE SUPPORT)
80.0000 ug | PREFILLED_SYRINGE | INTRAVENOUS | Status: DC | PRN
  Filled 2014-03-01: qty 2
  Filled 2014-03-01: qty 10

## 2014-03-01 MED ORDER — OXYTOCIN 40 UNITS IN LACTATED RINGERS INFUSION - SIMPLE MED: 62.5000 mL/h | INTRAVENOUS | Status: AC

## 2014-03-01 MED ORDER — EPHEDRINE 5 MG/ML INJ
10.0000 mg | INTRAVENOUS | Status: DC | PRN
  Filled 2014-03-01: qty 2

## 2014-03-01 MED ORDER — DIBUCAINE 1 % RE OINT: 1.0000 "application " | TOPICAL_OINTMENT | RECTAL | Status: DC | PRN

## 2014-03-01 MED ORDER — LACTATED RINGERS IV SOLN: 500.0000 mL | INTRAVENOUS | Status: DC | PRN

## 2014-03-01 MED ORDER — ADULT MULTIVITAMIN W/MINERALS CH
1.0000 | ORAL_TABLET | Freq: Every day | ORAL | Status: DC
  Filled 2014-03-01 (×3): qty 1

## 2014-03-01 MED ORDER — WITCH HAZEL-GLYCERIN EX PADS: 1.0000 "application " | MEDICATED_PAD | CUTANEOUS | Status: DC | PRN

## 2014-03-01 MED ORDER — OXYCODONE-ACETAMINOPHEN 5-325 MG PO TABS: 1.0000 | ORAL_TABLET | ORAL | Status: DC | PRN

## 2014-03-01 MED ORDER — TETANUS-DIPHTH-ACELL PERTUSSIS 5-2.5-18.5 LF-MCG/0.5 IM SUSP: 0.5000 mL | Freq: Once | INTRAMUSCULAR | Status: DC

## 2014-03-01 MED ORDER — OXYCODONE-ACETAMINOPHEN 5-325 MG PO TABS
1.0000 | ORAL_TABLET | ORAL | Status: DC | PRN
  Administered 2014-03-01 – 2014-03-02 (×2): 1 via ORAL
  Filled 2014-03-01 (×2): qty 1

## 2014-03-01 MED ORDER — PNEUMOCOCCAL VAC POLYVALENT 25 MCG/0.5ML IJ INJ
0.5000 mL | INJECTION | INTRAMUSCULAR | Status: DC
  Filled 2014-03-01: qty 0.5

## 2014-03-01 MED ORDER — OXYTOCIN 40 UNITS IN LACTATED RINGERS INFUSION - SIMPLE MED
1.0000 m[IU]/min | INTRAVENOUS | Status: DC
  Administered 2014-03-01: 1 m[IU]/min via INTRAVENOUS

## 2014-03-01 MED ORDER — LANOLIN HYDROUS EX OINT: TOPICAL_OINTMENT | CUTANEOUS | Status: DC | PRN

## 2014-03-01 MED ORDER — FENTANYL 2.5 MCG/ML BUPIVACAINE 1/10 % EPIDURAL INFUSION (WH - ANES)
14.0000 mL/h | INTRAMUSCULAR | Status: DC | PRN
  Administered 2014-03-01: 14 mL/h via EPIDURAL
  Filled 2014-03-01: qty 125

## 2014-03-01 MED ORDER — BENZOCAINE-MENTHOL 20-0.5 % EX AERO: 1.0000 "application " | INHALATION_SPRAY | CUTANEOUS | Status: DC | PRN

## 2014-03-01 MED ORDER — DIPHENHYDRAMINE HCL 25 MG PO CAPS: 25.0000 mg | ORAL_CAPSULE | Freq: Four times a day (QID) | ORAL | Status: DC | PRN

## 2014-03-01 MED ORDER — ALBUTEROL SULFATE (2.5 MG/3ML) 0.083% IN NEBU
2.5000 mg | INHALATION_SOLUTION | Freq: Four times a day (QID) | RESPIRATORY_TRACT | Status: DC | PRN
Start: 2014-03-01 — End: 2014-03-03

## 2014-03-01 MED ORDER — ONDANSETRON HCL 4 MG PO TABS: 4.0000 mg | ORAL_TABLET | ORAL | Status: DC | PRN

## 2014-03-01 MED ORDER — CITRIC ACID-SODIUM CITRATE 334-500 MG/5ML PO SOLN: 30.0000 mL | ORAL | Status: DC | PRN

## 2014-03-01 MED ORDER — IBUPROFEN 600 MG PO TABS
600.0000 mg | ORAL_TABLET | Freq: Four times a day (QID) | ORAL | Status: DC
  Administered 2014-03-01 – 2014-03-03 (×7): 600 mg via ORAL
  Filled 2014-03-01 (×7): qty 1

## 2014-03-01 MED ORDER — OXYTOCIN BOLUS FROM INFUSION: 500.0000 mL | INTRAVENOUS | Status: DC

## 2014-03-01 NOTE — Anesthesia Procedure Notes (Signed)
Epidural Patient location during procedure: OB  Staffing Anesthesiologist: Iyanna Drummer R Performed by: anesthesiologist   Preanesthetic Checklist Completed: patient identified, pre-op evaluation, timeout performed, IV checked, risks and benefits discussed and monitors and equipment checked  Epidural Patient position: sitting Prep: site prepped and draped and DuraPrep Patient monitoring: heart rate Approach: midline Location: L3-L4 Injection technique: LOR air and LOR saline  Needle:  Needle type: Tuohy  Needle gauge: 17 G Needle length: 9 cm Needle insertion depth: 5 cm Catheter type: closed end flexible Catheter size: 19 Gauge Catheter at skin depth: 11 cm Test dose: negative  Assessment Sensory level: T8 Events: blood not aspirated, injection not painful, no injection resistance, negative IV test and no paresthesia  Additional Notes Reason for block:procedure for pain   

## 2014-03-01 NOTE — Anesthesia Preprocedure Evaluation (Addendum)
Anesthesia Evaluation  Patient identified by MRN, date of birth, ID band Patient awake    Reviewed: Allergy & Precautions, H&P , NPO status , Patient's Chart, lab work & pertinent test results  Airway Mallampati: II  TM Distance: >3 FB Neck ROM: Full    Dental no notable dental hx.    Pulmonary asthma ,  breath sounds clear to auscultation  Pulmonary exam normal       Cardiovascular negative cardio ROS  Rhythm:Regular Rate:Normal     Neuro/Psych negative neurological ROS  negative psych ROS   GI/Hepatic negative GI ROS, Neg liver ROS,   Endo/Other  negative endocrine ROS  Renal/GU negative Renal ROS     Musculoskeletal negative musculoskeletal ROS (+)   Abdominal   Peds  Hematology negative hematology ROS (+)   Anesthesia Other Findings   Reproductive/Obstetrics (+) Pregnancy                            Anesthesia Physical Anesthesia Plan  ASA: II  Anesthesia Plan: Epidural   Post-op Pain Management:    Induction:   Airway Management Planned:   Additional Equipment:   Intra-op Plan:   Post-operative Plan:   Informed Consent: I have reviewed the patients History and Physical, chart, labs and discussed the procedure including the risks, benefits and alternatives for the proposed anesthesia with the patient or authorized representative who has indicated his/her understanding and acceptance.     Plan Discussed with:   Anesthesia Plan Comments:         Anesthesia Quick Evaluation

## 2014-03-01 NOTE — MAU Note (Signed)
Pt states water broke at 0700 clear fluid.  Some contractions.  Bloody show.

## 2014-03-01 NOTE — Progress Notes (Signed)
Dr. Ambrose MantleHenley in room for delivery

## 2014-03-01 NOTE — Progress Notes (Signed)
Patient ID: Brandi Ayala, female   DOB: 02/01/1986, 28 y.o.   MRN: [id number] Delivery note:  The pt progressed to full dilatation and pushed well to deliver a living female infant spontaneously ROA over an intact perineum . Apgars were 9 and 9 at 1 and 5 minutes. The placenta delivered intact and the uterus was normal except for fragments of membranes which were removed manually. There were no lacerations. EBL 400 cc's.

## 2014-03-01 NOTE — H&P (Signed)
NAMWonda Ayala:  Nicosia, Yuki               ACCOUNT NO.:  [id number]  MEDICAL RECORD NO.:  [id number]  LOCATION:  9170                          FACILITY:  [facility]  PHYSICIAN:  Malachi Prohomas F. Ambrose MantleHenley, M.D. DATE OF BIRTH:  November 05, 1985  DATE OF ADMISSION:  03/01/2014 DATE OF DISCHARGE:                             HISTORY & PHYSICAL   HISTORY OF PRESENT ILLNESS:  This is a 28 year old white female, para 3- 0-4-3, gravida 8 with EDC of March 11, 2014, admitted with premature rupture of the membranes.  The patient began her prenatal course at 11 weeks and 3 days.  Her blood group and type is B positive.  Negative antibody.  RPR negative.  Urine culture negative.  Hepatitis B surface antigen negative, HIV negative, GC and Chlamydia negative.  Rubella immune, Pap smear normal.  First trimester screen normal.  AFP normal. One-hour Glucola 123, repeat 119. HIV and RPR negative at 36 weeks. Group B strep negative.  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  She began her prenatal course at 152 pounds.  The entire pregnancy, she gained 21-1/2.  Her blood pressure has remained normal throughout pregnancy.  On February 25, 2014; her cervix was 1 cm, 70%, vertex at a -3.  She called this morning stating that she had contractions for 2-3 hours and she had heard an audible pop that represented her rupture of membranes.  She came to the hospital, ruptured membrane was confirmed, and she was admitted.  PAST MEDICAL HISTORY:  Gestational diabetes mellitus x2, fractured left leg at age 89.  She had a history of PMS or PMDD, has a history of asthma.  PAST SURGICAL HISTORY:  LEEP, cone biopsy in August 2014.  ALLERGIES:  SULFA caused anaphylaxis.  No latex allergy.  She is allergic to JALAPENO PEPPERS.  SOCIAL HISTORY:  She has never smoked.  Moderate alcohol before pregnancy.  Denied illicit drugs.  She works at [employer].  She is single.  FAMILY HISTORY:  Maternal aunt cancer of the  cervix.  Mother with diabetes.  PHYSICAL EXAMINATION:  GENERAL:  On admission, a well-developed white female, in no distress. VITAL SIGNS:  Temperature 97.8, pulse 86, respirations 20, blood pressure 107/69. HEART:  Normal size and sounds.  No murmurs. LUNGS:  Clear to auscultation. On February 25, 2014; fundal height was 39.5 cm.  According to the admitting nurse, her cervix was 1.5 cm, 80%, vertex at a -2.  ADMITTING IMPRESSION:  Intrauterine pregnancy at 38 weeks and 4 days, premature rupture of the membranes.  The patient is admitted for observation to see if she is in labor, and if not, Pitocin will be started.     Malachi Prohomas F. Ambrose MantleHenley, M.D.     TFH/MEDQ  D:  03/01/2014  T:  03/01/2014  Job:  [id number]

## 2014-03-01 NOTE — Progress Notes (Signed)
Patient ID: Brandi Ayala, female   DOB: 09/12/1985, 28 y.o.   MRN: [id number] Pt received her epidural and is comfortable Contractions are q 4 minutes. Per the RN the cervix is 8 cm 100% effaced and the vertex is at 0 station.

## 2014-03-01 NOTE — Plan of Care (Signed)
Problem: Phase I Progression Outcomes Goal: Pain controlled with appropriate interventions Outcome: Completed/Met Date Met:  03/01/14 Goal: Voiding adequately Outcome: Completed/Met Date Met:  03/01/14 Goal: OOB as tolerated unless otherwise ordered Outcome: Completed/Met Date Met:  03/01/14 Goal: VS, stable, temp < 100.4 degrees F Outcome: Completed/Met Date Met:  03/01/14

## 2014-03-01 NOTE — Plan of Care (Signed)
Problem: Phase I Progression Outcomes Goal: Initial discharge plan identified Outcome: Completed/Met Date Met:  03/01/14

## 2014-03-02 LAB — CBC
HCT: 35.5 % — ABNORMAL LOW (ref 36.0–46.0)
Hemoglobin: 11.7 g/dL — ABNORMAL LOW (ref 12.0–15.0)
MCH: 27.2 pg (ref 26.0–34.0)
MCHC: 33 g/dL (ref 30.0–36.0)
MCV: 82.6 fL (ref 78.0–100.0)
PLATELETS: 140 10*3/uL — AB (ref 150–400)
RBC: 4.3 MIL/uL (ref 3.87–5.11)
RDW: 13.7 % (ref 11.5–15.5)
WBC: 14.4 10*3/uL — ABNORMAL HIGH (ref 4.0–10.5)

## 2014-03-02 MED ORDER — MENTHOL 3 MG MT LOZG
1.0000 | LOZENGE | OROMUCOSAL | Status: DC | PRN
  Administered 2014-03-02: 3 mg via ORAL
  Filled 2014-03-02: qty 9

## 2014-03-02 MED ORDER — PSEUDOEPHEDRINE HCL 30 MG PO TABS
30.0000 mg | ORAL_TABLET | Freq: Four times a day (QID) | ORAL | Status: DC | PRN
  Administered 2014-03-02 – 2014-03-03 (×3): 30 mg via ORAL
  Filled 2014-03-02 (×3): qty 1

## 2014-03-02 NOTE — Lactation Note (Addendum)
This note was copied from the chart of Boy Wonda Chengshley Fortner. Lactation Consultation Note: Mother breastfed her first child for 4 months. She states she bottle fed the last 2 children. She states that her infant is feeding well. Reviewed hand expression and mother states she sees colostrum. Mother was informed of cue base feeding. She was advised to breastfeed infant 8-12 times in 24 hours. Mother has her 3 other children at the bedside at present. She states that her infant just fed for 18  mins and she feels strong tugging without pain. Mother to page for assistance as needed for next feeding.  Mother was given Lactation Brochure with review of basics. Mother was informed of LC services and community support.   Patient Name: Boy Wonda Chengshley Fetherolf ZOXWR'UToday's Date: 03/02/2014 Reason for consult: Initial assessment   Maternal Data Has patient been taught Hand Expression?: Yes Does the patient have breastfeeding experience prior to this delivery?: Yes  Feeding Feeding Type: Breast Fed Length of feed: 18 min (per mom)  LATCH Score/Interventions                      Lactation Tools Discussed/Used     Consult Status Consult Status: Follow-up Date: 03/03/14 Follow-up type: In-patient    Stevan BornKendrick, Dalayna [facility] 03/02/2014, 3:01 PM

## 2014-03-02 NOTE — Plan of Care (Signed)
Problem: Phase II Progression Outcomes Goal: Pain controlled on oral analgesia Outcome: Completed/Met Date Met:  03/02/14 Goal: Progress activity as tolerated unless otherwise ordered Outcome: Completed/Met Date Met:  03/02/14 Goal: Afebrile, VS remain stable Outcome: Completed/Met Date Met:  03/02/14 Goal: Tolerating diet Outcome: Completed/Met Date Met:  03/02/14

## 2014-03-02 NOTE — Anesthesia Postprocedure Evaluation (Signed)
Anesthesia Post Note  Patient: Brandi Ayala  Procedure(s) Performed: * No procedures listed *  Anesthesia type: Epidural  Patient location: Mother/Baby  Post pain: Pain level controlled  Post assessment: Post-op Vital signs reviewed  Last Vitals:  Filed Vitals:   03/02/14 0552  BP: 101/58  Pulse: 65  Temp: 36.3 C  Resp:     Post vital signs: Reviewed  Level of consciousness:alert  Complications: No apparent anesthesia complications

## 2014-03-02 NOTE — Progress Notes (Signed)
Patient ID: Brandi Ayala, female   DOB: 04/04/1986, 28 y.o.   MRN: [id number] #1 afebrile BP normal HGB stable

## 2014-03-02 NOTE — Plan of Care (Signed)
Problem: Discharge Progression Outcomes Goal: Pain controlled with appropriate interventions Outcome: Completed/Met Date Met:  03/02/14     

## 2014-03-02 NOTE — Plan of Care (Signed)
Problem: Discharge Progression Outcomes Goal: Barriers To Progression Addressed/Resolved Outcome: Not Applicable Date Met:  02/15/12 Goal: Tolerating diet Outcome: Completed/Met Date Met:  14/38/88 Goal: Complications resolved/controlled Outcome: Not Applicable Date Met:  75/79/72

## 2014-03-03 MED ORDER — IBUPROFEN 600 MG PO TABS: 600.0000 mg | ORAL_TABLET | Freq: Four times a day (QID) | ORAL | Status: AC | PRN

## 2014-03-03 MED ORDER — OXYCODONE-ACETAMINOPHEN 5-325 MG PO TABS: 1.0000 | ORAL_TABLET | Freq: Four times a day (QID) | ORAL | Status: DC | PRN

## 2014-03-03 NOTE — Progress Notes (Signed)
Patient ID: Brandi Ayala, female   DOB: 05/25/1985, 28 y.o.   MRN: [id number] #2 afebrile BP normal No problems

## 2014-03-03 NOTE — Discharge Instructions (Signed)
booklet °

## 2014-03-03 NOTE — Progress Notes (Signed)
UR chart review completed.  

## 2014-03-03 NOTE — Discharge Summary (Signed)
NAMWonda Cheng:  Ayala, Brandi               ACCOUNT NO.:  [id number]  MEDICAL RECORD NO.:  [id number]  LOCATION:  9121                          FACILITY:  [facility]  PHYSICIAN:  Malachi Prohomas F. Ambrose MantleHenley, M.D. DATE OF BIRTH:  20-Jun-1985  DATE OF ADMISSION:  03/01/2014 DATE OF DISCHARGE:  03/03/2014                              DISCHARGE SUMMARY   HOSPITAL COURSE:  This is a 28 year old white female, para 3-0-4-3, gravida 8 with EDC of March 11, 2014, admitted with premature rupture of the membranes.  The patient called on the morning of admission, stated she had contractions for 2-3 hours.  She heard an audible pop that represented rupture of membranes.  She came to the hospital, ruptured membranes was confirmed and she was admitted.  Her cervix was 1.5 cm, 80% effaced, vertex at -2.  The patient was given an epidural, she progressed in labor, reached full dilatation and pushed well, delivered a living female infant, ROA over an intact perineum.  Apgars were 9 and 9 at 1 and 5 minutes.  Postpartum, the patient did well and was discharged on the second postpartum day.  Initial hemoglobin 11.5, hematocrit 35.6, white count 10,100, platelet count 141,000, and followup hemoglobin 11.7.  FINAL DIAGNOSES:  Intrauterine pregnancy at 38+ weeks, delivered right occiput anterior.  OPERATION:  Spontaneous delivery, ROA.  FINAL CONDITION:  Improved.  INSTRUCTIONS:  Include our regular discharge instruction booklet as well as after visit summary.  Prescriptions for Motrin 600 mg 30 tablets, 1 every 6 hours as needed for pain and Percocet 5/325, 30 tablets, 1 every 6 hours as needed for pain.  She is asked to return to the office in 6 weeks for followup examination.  She also is asked to return to have her baby circumcised if she desires.     Malachi Prohomas F. Ambrose MantleHenley, M.D.     TFH/MEDQ  D:  03/03/2014  T:  03/03/2014  Job:  [id number]

## 2014-03-03 NOTE — Lactation Note (Signed)
This note was copied from the chart of Brandi Wonda Chengshley Cyran. Lactation Consultation Note Mom denying painful latches. Assessed nipples and no bruising noted, denies soreness. Requesting hand pump, demonstrated how to use it, discussed engorgement prevention, cleaning of pump, and storage of milk. Encouraged to document I&O. Discussed supply and demand. Reminded of support groups and LC consult services.  Patient Name: Brandi Ayala Reason for consult: Follow-up assessment   Maternal Data    Feeding    LATCH Score/Interventions                      Lactation Tools Discussed/Used Pump Review: Setup, frequency, and cleaning;Milk Storage Initiated by:: Elbert EwingsL. Sabirin Baray RN Date initiated:: 03/03/14   Consult Status Consult Status: Complete Date: 03/03/14 Follow-up type: Call as needed    Clois Treanor, [name], 10:52 AM

## 2014-03-03 NOTE — Plan of Care (Signed)
Problem: Discharge Progression Outcomes Goal: Activity appropriate for discharge plan Outcome: Completed/Met Date Met:  03/03/14     

## 2014-05-02 ENCOUNTER — Encounter (HOSPITAL_COMMUNITY): Payer: Self-pay | Admitting: *Deleted

## 2015-07-17 IMAGING — US US OB COMP LESS 14 WK
1 series · 14 of 28 positions shown · non-contrast
Comparison: None.

CLINICAL DATA: Pregnant patient with pelvic pain.

EXAM:
OBSTETRIC <14 WK US AND TRANSVAGINAL OB US
TECHNIQUE: Both transabdominal and transvaginal ultrasound examinations were
performed for complete evaluation of the gestation as well as the
maternal uterus, adnexal regions, and pelvic cul-de-sac.
Transvaginal technique was performed to assess early pregnancy.

[Series 1: us ob comp less 14 wk · 0.22mm/px · 14 of 61 slices shown]
[im 3/61]
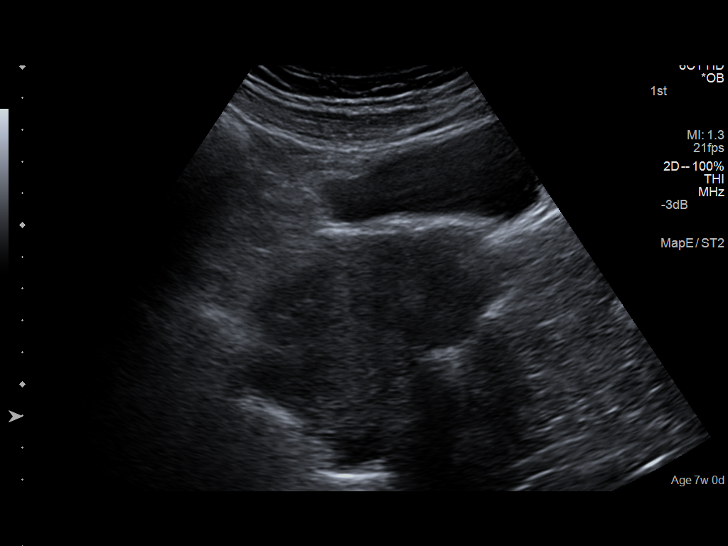
[im 7/61]
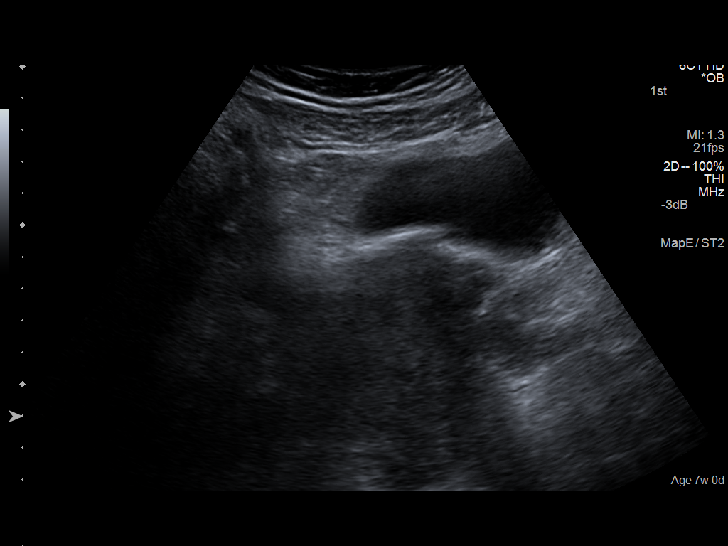
[im 12/61]
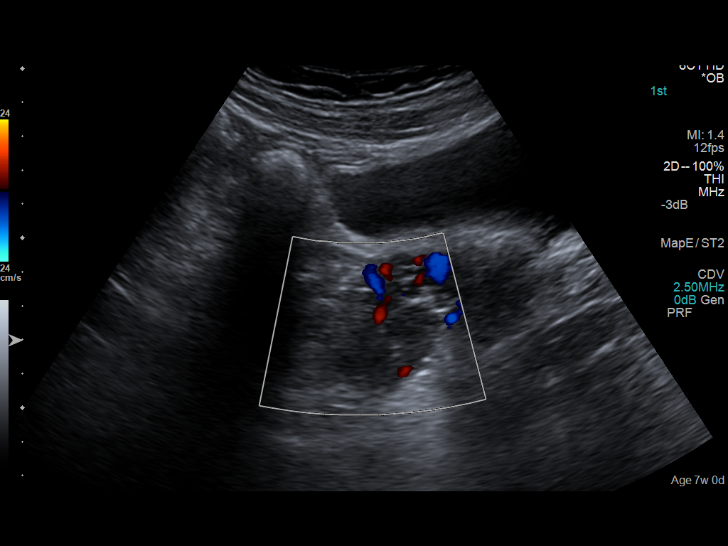
[im 16/61]
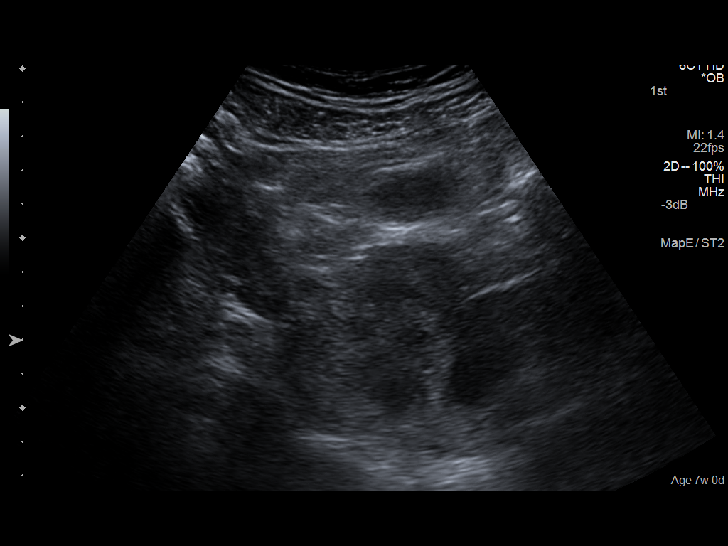
[im 21/61]
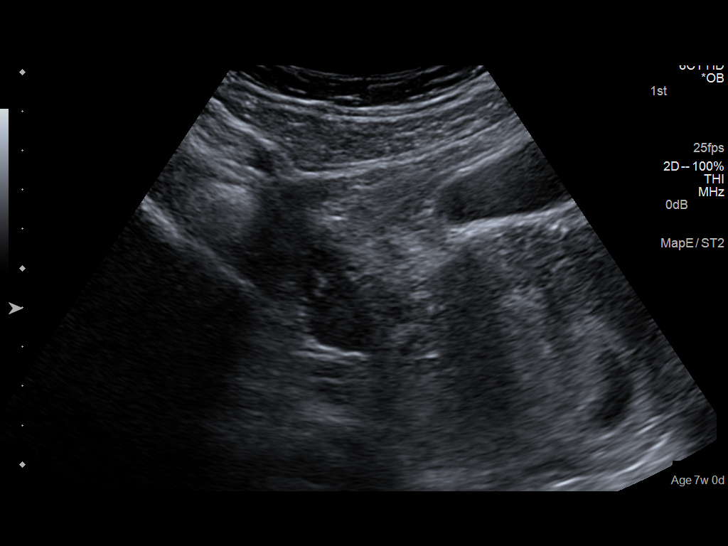
[im 25/61]
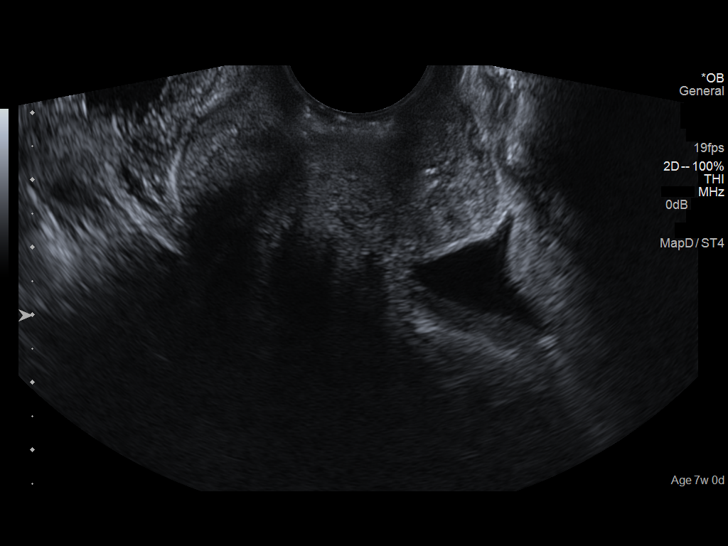
[im 29/61]
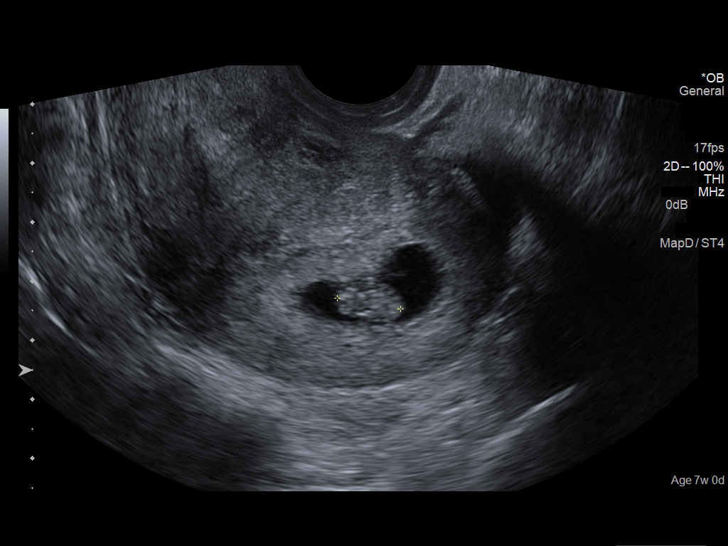
[im 34/61]
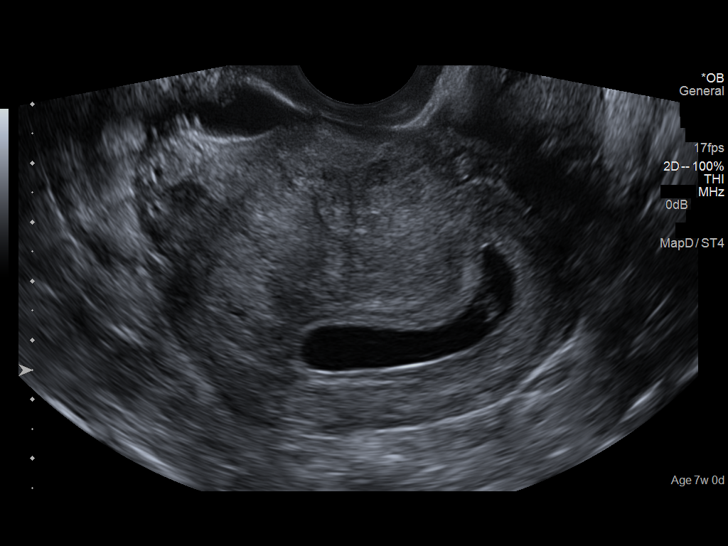
[im 38/61]
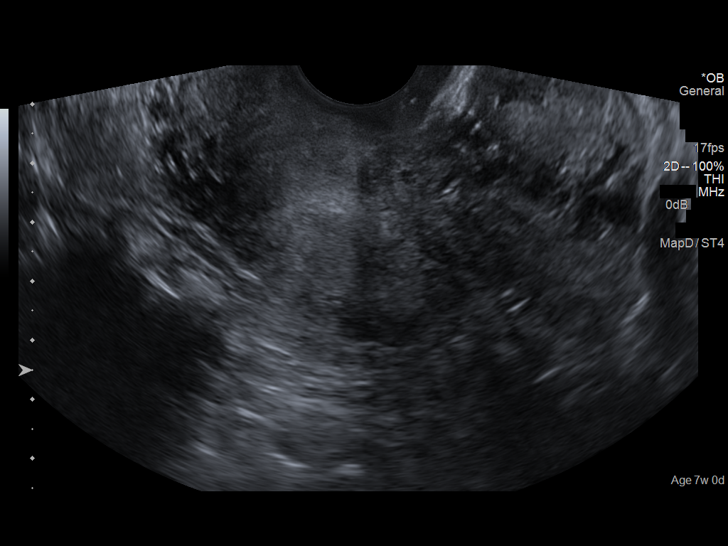
[im 43/61]
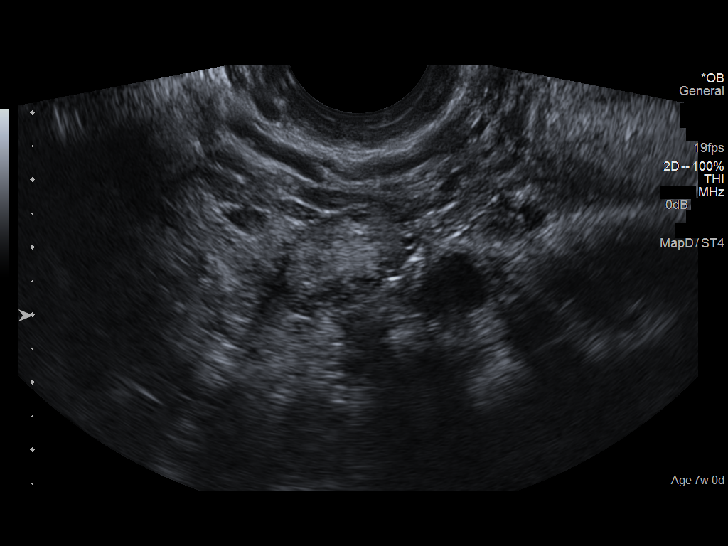
[im 47/61]
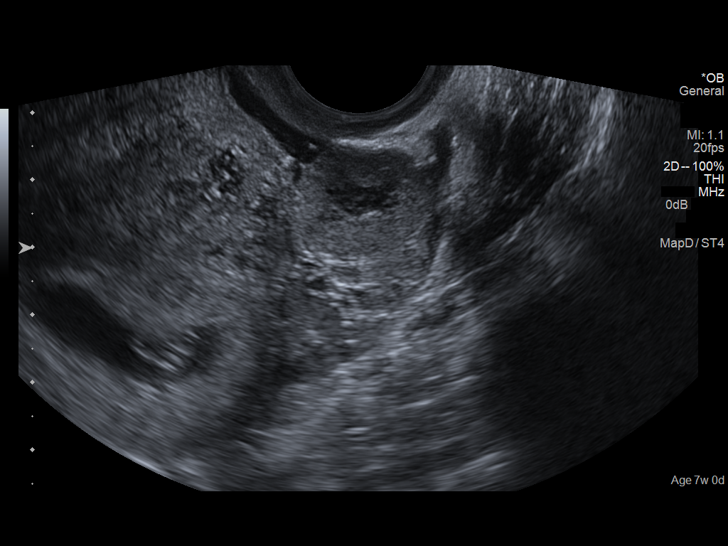
[im 52/61]
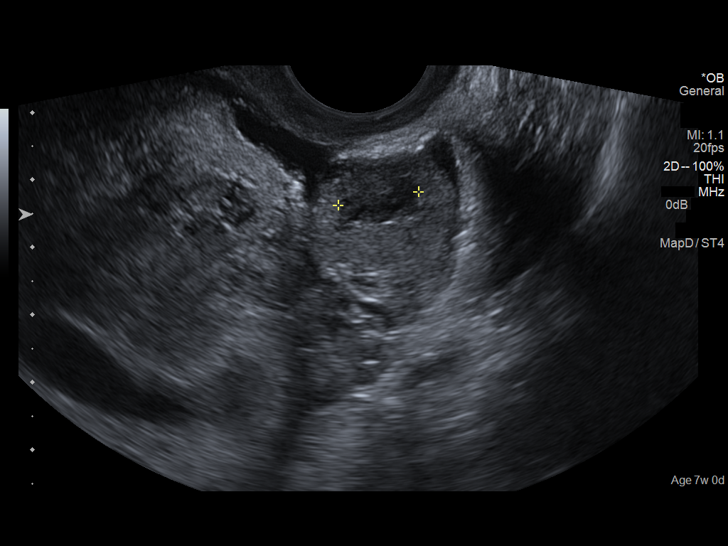
[im 56/61]
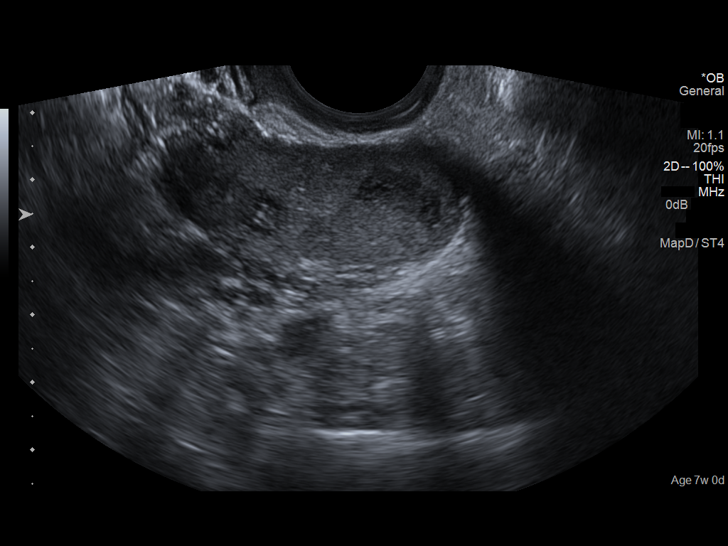
[im 61/61]
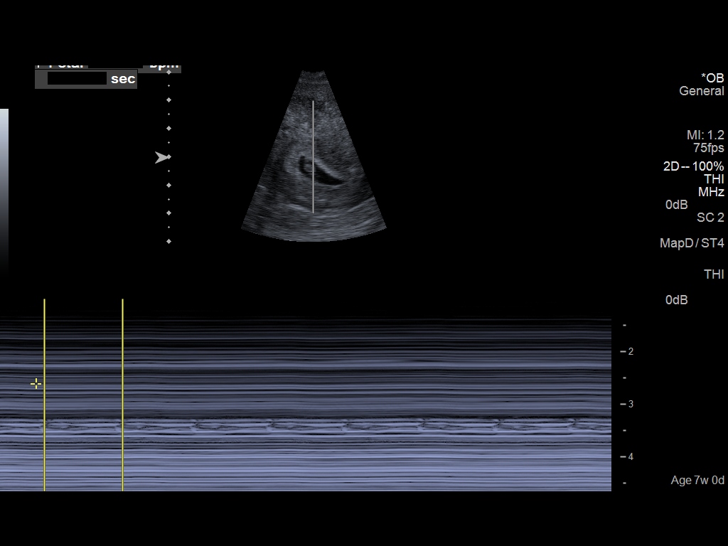

[14 of 28 positions shown; findings below may reference images not displayed]

FINDINGS: Intrauterine gestational sac: Visualized/normal in shape.

Yolk sac:  Visualized.

Embryo:  Visualized.

Cardiac Activity: Detected.

Heart Rate:  131 bpm

CRL:   1.0  mm   7 w 0 d                  US EDC: 03/11/2014.

Maternal uterus/adnexae: Unremarkable. Tiny amount of fluid about
the left adnexa noted.
IMPRESSION: Single living intrauterine pregnancy without complicating feature.
No acute abnormality.

## 2017-08-01 ENCOUNTER — Ambulatory Visit (INDEPENDENT_AMBULATORY_CARE_PROVIDER_SITE_OTHER): Payer: Medicaid Other | Admitting: Psychiatry

## 2017-08-01 DIAGNOSIS — F4323 Adjustment disorder with mixed anxiety and depressed mood: Secondary | ICD-10-CM

## 2017-08-24 ENCOUNTER — Ambulatory Visit: Payer: Medicaid Other | Admitting: Psychiatry

## 2017-09-14 ENCOUNTER — Ambulatory Visit: Payer: Medicaid Other | Admitting: Psychiatry

## 2022-03-06 ENCOUNTER — Other Ambulatory Visit: Payer: Self-pay

## 2022-03-06 ENCOUNTER — Encounter (HOSPITAL_BASED_OUTPATIENT_CLINIC_OR_DEPARTMENT_OTHER): Payer: Self-pay | Admitting: Emergency Medicine

## 2022-03-06 ENCOUNTER — Emergency Department (HOSPITAL_BASED_OUTPATIENT_CLINIC_OR_DEPARTMENT_OTHER)
Admission: EM | Admit: 2022-03-06 | Discharge: 2022-03-06 | Disposition: A | Discharge status: Home / Self Care | Payer: Medicaid Other | Attending: Emergency Medicine | Admitting: Emergency Medicine

## 2022-03-06 ENCOUNTER — Emergency Department (HOSPITAL_BASED_OUTPATIENT_CLINIC_OR_DEPARTMENT_OTHER): Payer: Medicaid Other

## 2022-03-06 DIAGNOSIS — N200 Calculus of kidney: Secondary | ICD-10-CM | POA: Diagnosis not present

## 2022-03-06 DIAGNOSIS — R109 Unspecified abdominal pain: Secondary | ICD-10-CM | POA: Diagnosis present

## 2022-03-06 DIAGNOSIS — J45909 Unspecified asthma, uncomplicated: Secondary | ICD-10-CM | POA: Insufficient documentation

## 2022-03-06 LAB — WET PREP, GENITAL
Clue Cells Wet Prep HPF POC: NONE SEEN
Sperm: NONE SEEN
Trich, Wet Prep: NONE SEEN
WBC, Wet Prep HPF POC: <10 (ref <10)
Yeast Wet Prep HPF POC: NONE SEEN

## 2022-03-06 LAB — CBC WITH DIFFERENTIAL/PLATELET
Abs Immature Granulocytes: 0.02 10*3/uL (ref 0.00–0.07)
Basophils Absolute: 0 10*3/uL (ref 0.0–0.1)
Basophils Relative: 1 %
Eosinophils Absolute: 0.1 10*3/uL (ref 0.0–0.5)
Eosinophils Relative: 2 %
HCT: 39.3 % (ref 36.0–46.0)
Hemoglobin: 13.1 g/dL (ref 12.0–15.0)
Immature Granulocytes: 0 %
Lymphocytes Relative: 38 %
Lymphs Abs: 2.9 10*3/uL (ref 0.7–4.0)
MCH: 29.7 pg (ref 26.0–34.0)
MCHC: 33.3 g/dL (ref 30.0–36.0)
MCV: 89.1 fL (ref 80.0–100.0)
Monocytes Absolute: 0.6 10*3/uL (ref 0.1–1.0)
Monocytes Relative: 7 %
Neutro Abs: 4 10*3/uL (ref 1.7–7.7)
Neutrophils Relative %: 52 %
Platelets: 179 10*3/uL (ref 150–400)
RBC: 4.41 MIL/uL (ref 3.87–5.11)
RDW: 12.3 % (ref 11.5–15.5)
WBC: 7.6 10*3/uL (ref 4.0–10.5)
nRBC: 0 % (ref 0.0–0.2)

## 2022-03-06 LAB — URINALYSIS, ROUTINE W REFLEX MICROSCOPIC
Bilirubin Urine: NEGATIVE
Glucose, UA: NEGATIVE mg/dL
Hgb urine dipstick: NEGATIVE
Ketones, ur: NEGATIVE mg/dL
Leukocytes,Ua: NEGATIVE
Nitrite: NEGATIVE
Protein, ur: NEGATIVE mg/dL
Specific Gravity, Urine: >=1.03 (ref 1.005–1.030)
pH: 5.5 (ref 5.0–8.0)

## 2022-03-06 LAB — PREGNANCY, URINE: Preg Test, Ur: NEGATIVE

## 2022-03-06 LAB — BASIC METABOLIC PANEL
Anion gap: 8 (ref 5–15)
BUN: 16 mg/dL (ref 6–20)
CO2: 23 mmol/L (ref 22–32)
Calcium: 9.3 mg/dL (ref 8.9–10.3)
Chloride: 105 mmol/L (ref 98–111)
Creatinine, Ser: 0.72 mg/dL (ref 0.44–1.00)
GFR, Estimated: >60 mL/min (ref >60)
Glucose, Bld: 91 mg/dL (ref 70–99)
Potassium: 3.6 mmol/L (ref 3.5–5.1)
Sodium: 136 mmol/L (ref 135–145)

## 2022-03-06 MED ORDER — ONDANSETRON HCL 4 MG/2ML IJ SOLN
4.0000 mg | Freq: Once | INTRAMUSCULAR | Status: AC
  Administered 2022-03-06: 4 mg via INTRAVENOUS
  Filled 2022-03-06: qty 2

## 2022-03-06 MED ORDER — LACTATED RINGERS IV BOLUS
1000.0000 mL | Freq: Once | INTRAVENOUS | Status: AC
  Administered 2022-03-06: 1000 mL via INTRAVENOUS

## 2022-03-06 MED ORDER — MORPHINE SULFATE (PF) 4 MG/ML IV SOLN
4.0000 mg | Freq: Once | INTRAVENOUS | Status: AC
  Administered 2022-03-06: 4 mg via INTRAVENOUS
  Filled 2022-03-06: qty 1

## 2022-03-06 MED ORDER — IOHEXOL 300 MG/ML  SOLN
75.0000 mL | Freq: Once | INTRAMUSCULAR | Status: AC | PRN
  Administered 2022-03-06: 75 mL via INTRAVENOUS

## 2022-03-06 MED ORDER — HYDROCODONE-ACETAMINOPHEN 5-325 MG PO TABS: 1.0000 | ORAL_TABLET | Freq: Four times a day (QID) | ORAL | 0 refills | Status: AC | PRN

## 2022-03-06 MED ORDER — ONDANSETRON 4 MG PO TBDP: 4.0000 mg | ORAL_TABLET | Freq: Three times a day (TID) | ORAL | 0 refills | Status: AC | PRN

## 2022-03-06 NOTE — ED Triage Notes (Signed)
Pain in right side moving to back X 1 week worse last 2 days.

## 2022-03-06 NOTE — ED Provider Notes (Signed)
[facility] EMERGENCY DEPARTMENT Provider Note   CSN: [id number] Arrival date & time: 03/06/22  1948     History  Chief Complaint  Patient presents with   Flank Pain    Brandi Ayala is a 36 y.o. female.  36 year old female presents today for evaluation of right-sided flank pain that has been going on for a week worse in the past couple days.  She denies nausea, vomiting, fever, dysuria, vaginal discharge, change in sexual partner, vaginal bleeding.  He is unsure of any alleviating or aggravating factors.  Tolerating p.o. intake without difficulty.  Denies prior history of kidney stones.  The history is provided by the patient. No language interpreter was used.       Home Medications Prior to Admission medications   Medication Sig Start Date End Date Taking? Authorizing Provider  albuterol (PROVENTIL HFA;VENTOLIN HFA) 108 (90 BASE) MCG/ACT inhaler Inhale 1 puff into the lungs every 6 (six) hours as needed for wheezing or shortness of breath.    [provider]  ibuprofen (ADVIL,MOTRIN) 600 MG tablet Take 1 tablet (600 mg total) by mouth every 6 (six) hours as needed. 03/03/14   Tracey Harries, MD  Multiple Vitamin (MULTIVITAMIN WITH MINERALS) TABS tablet Take 1 tablet by mouth daily.    [provider]  oxyCODONE-acetaminophen (PERCOCET/ROXICET) 5-325 MG per tablet Take 1 tablet by mouth every 6 (six) hours as needed (for pain scale less than 7). 03/03/14   Tracey Harries, MD      Allergies    Other and Sulfa antibiotics    Review of Systems   Review of Systems  Constitutional:  Negative for chills and fever.  Gastrointestinal:  Negative for abdominal pain, nausea and vomiting.  Genitourinary:  Positive for flank pain. Negative for dysuria, frequency, pelvic pain, vaginal bleeding and vaginal discharge.  All other systems reviewed and are negative.   Physical Exam Updated Vital Signs BP 126/81 (BP Location: Right Arm)   Pulse (!) 59   Temp  98.6 F (37 C) (Oral)   Resp 16   Ht 5\' 6"  (1.676 m)   Wt 79.4 kg   LMP 02/24/2022   SpO2 100%   BMI 28.25 kg/m  Physical Exam Vitals and nursing note reviewed.  Constitutional:      General: She is not in acute distress.    Appearance: Normal appearance. She is not ill-appearing.  HENT:     Head: Normocephalic and atraumatic.     Nose: Nose normal.  Eyes:     General: No scleral icterus.    Extraocular Movements: Extraocular movements intact.     Conjunctiva/sclera: Conjunctivae normal.  Cardiovascular:     Rate and Rhythm: Normal rate and regular rhythm.     Pulses: Normal pulses.  Pulmonary:     Effort: Pulmonary effort is normal. No respiratory distress.     Breath sounds: Normal breath sounds. No wheezing or rales.  Abdominal:     General: There is no distension.     Tenderness: There is no abdominal tenderness. There is no right CVA tenderness, left CVA tenderness, guarding or rebound.  Musculoskeletal:        General: Normal range of motion.     Cervical back: Normal range of motion.  Skin:    General: Skin is warm and dry.  Neurological:     General: No focal deficit present.     Mental Status: She is alert. Mental status is at baseline.     ED Results / Procedures /  Treatments   Labs (all labs ordered are listed, but only abnormal results are displayed) Labs Reviewed  URINALYSIS, ROUTINE W REFLEX MICROSCOPIC  PREGNANCY, URINE  CBC WITH DIFFERENTIAL/PLATELET  BASIC METABOLIC PANEL    EKG None  Radiology No results found.  Procedures Procedures    Medications Ordered in ED Medications  morphine (PF) 4 MG/ML injection 4 mg (has no administration in time range)  ondansetron (ZOFRAN) injection 4 mg (has no administration in time range)  lactated ringers bolus 1,000 mL (has no administration in time range)    ED Course/ Medical Decision Making/ A&P                           Medical Decision Making Amount and/or Complexity of Data  Reviewed Labs: ordered. Radiology: ordered.  Risk Prescription drug management.   Medical Decision Making / ED Course   This patient presents to the ED for concern of right-sided flank pain, this involves an extensive number of treatment options, and is a complaint that carries with it a high risk of complications and morbidity.  The differential diagnosis includes nephrolithiasis, pyelonephritis, UTI, PID, STI, appendicitis, cholecystitis  MDM: 36 year old female presents with above-mentioned complaints.  Overall well-appearing.  Afebrile, normal rate.  No prior history of kidney stones.  Abdomen is benign.  Without evidence of UTI.  CBC is unremarkable, BMP is unremarkable.  Pregnancy test is negative.  CT abdomen pelvis shows hydronephrosis, inflammatory changes consistent with recently passed stone versus UTI.  However without UTI symptoms, and UA without evidence of suspicion for UTI.  Likely recently passed stone given the renal stones noted.  Just prior to discharge as we were discussing results patient states while boyfriend was outside of the room that she is concerned her boyfriend has been unfaithful and would like STI testing.  Given she is without vaginal discharge, fever low suspicion for PID given the findings on CT scan.  Patient would like to self swab.  Will obtain this.   Lab Tests: -I ordered, reviewed, and interpreted labs.   The pertinent results include:   Labs Reviewed  WET PREP, GENITAL  URINALYSIS, ROUTINE W REFLEX MICROSCOPIC  PREGNANCY, URINE  CBC WITH DIFFERENTIAL/PLATELET  BASIC METABOLIC PANEL  GC/CHLAMYDIA PROBE AMP ([place]) NOT AT [facility]      EKG  EKG Interpretation  Date/Time:    Ventricular Rate:    PR Interval:    QRS Duration:   QT Interval:    QTC Calculation:   R Axis:     Text Interpretation:           Imaging Studies ordered: I ordered imaging studies including CT abdomen pelvis with contrast I independently visualized and  interpreted imaging. I agree with the radiologist interpretation   Medicines ordered and prescription drug management: Meds ordered this encounter  Medications   morphine (PF) 4 MG/ML injection 4 mg   ondansetron (ZOFRAN) injection 4 mg   lactated ringers bolus 1,000 mL   iohexol (OMNIPAQUE) 300 MG/ML solution 75 mL    -I have reviewed the patients home medicines and have made adjustments as needed   Reevaluation: After the interventions noted above, I reevaluated the patient and found that they have :improved  Co morbidities that complicate the patient evaluation  Past Medical History:  Diagnosis Date   Asthma       Dispostion: Patient is appropriate for discharge.  Return precaution discussed.  Patient voices understanding and is in agreement  with plan.  Final Clinical Impression(s) / ED Diagnoses Final diagnoses:  Nephrolithiasis    Rx / DC Orders ED Discharge Orders          Ordered    HYDROcodone-acetaminophen (NORCO/VICODIN) 5-325 MG tablet  Every 6 hours PRN        03/06/22 2337    ondansetron (ZOFRAN-ODT) 4 MG disintegrating tablet  Every 8 hours PRN        03/06/22 2337              Marita Kansas, PA-C 03/06/22 2338    Virgina Norfolk, DO 03/07/22 0007

## 2022-03-06 NOTE — Discharge Instructions (Addendum)
Your cup today showed that you likely had a recent kidney stone that passed.  This is likely the cause of your symptoms.  There are few additional kidney stones that are present within the kidneys.  As long as they are within the kidney they do not cause any pain or issues.  It is as they pass through the ureters that they cause pain and symptoms.  Kidney function, lab work otherwise was reassuring.  I have sent pain medication and nausea medication into the pharmacy for you.  For any concerning symptoms return to the emergency room otherwise please establish care with a primary care provider.  I have attached information above for you.

## 2022-03-08 LAB — GC/CHLAMYDIA PROBE AMP (~~LOC~~) NOT AT ARMC
Chlamydia: NEGATIVE
Comment: NEGATIVE
Comment: NORMAL
Neisseria Gonorrhea: NEGATIVE

## 2022-03-20 ENCOUNTER — Emergency Department (HOSPITAL_BASED_OUTPATIENT_CLINIC_OR_DEPARTMENT_OTHER): Payer: Medicaid Other

## 2022-03-20 ENCOUNTER — Other Ambulatory Visit: Payer: Self-pay

## 2022-03-20 ENCOUNTER — Emergency Department (HOSPITAL_BASED_OUTPATIENT_CLINIC_OR_DEPARTMENT_OTHER)
Admission: EM | Admit: 2022-03-20 | Discharge: 2022-03-20 | Disposition: A | Discharge status: Home / Self Care | Payer: Medicaid Other | Attending: Emergency Medicine | Admitting: Emergency Medicine

## 2022-03-20 ENCOUNTER — Encounter (HOSPITAL_BASED_OUTPATIENT_CLINIC_OR_DEPARTMENT_OTHER): Payer: Self-pay | Admitting: Emergency Medicine

## 2022-03-20 DIAGNOSIS — S22019A Unspecified fracture of first thoracic vertebra, initial encounter for closed fracture: Secondary | ICD-10-CM | POA: Diagnosis not present

## 2022-03-20 DIAGNOSIS — W109XXA Fall (on) (from) unspecified stairs and steps, initial encounter: Secondary | ICD-10-CM | POA: Insufficient documentation

## 2022-03-20 DIAGNOSIS — S299XXA Unspecified injury of thorax, initial encounter: Secondary | ICD-10-CM | POA: Diagnosis present

## 2022-03-20 DIAGNOSIS — W19XXXA Unspecified fall, initial encounter: Secondary | ICD-10-CM

## 2022-03-20 DIAGNOSIS — M545 Low back pain, unspecified: Secondary | ICD-10-CM | POA: Diagnosis not present

## 2022-03-20 DIAGNOSIS — S22009A Unspecified fracture of unspecified thoracic vertebra, initial encounter for closed fracture: Secondary | ICD-10-CM

## 2022-03-20 MED ORDER — FENTANYL CITRATE PF 50 MCG/ML IJ SOSY
50.0000 ug | PREFILLED_SYRINGE | INTRAMUSCULAR | Status: AC | PRN
  Administered 2022-03-20 (×2): 50 ug via INTRAVENOUS
  Filled 2022-03-20 (×2): qty 1

## 2022-03-20 MED ORDER — METHOCARBAMOL 500 MG PO TABS: 500.0000 mg | ORAL_TABLET | Freq: Two times a day (BID) | ORAL | 0 refills | Status: AC

## 2022-03-20 MED ORDER — OXYCODONE-ACETAMINOPHEN 5-325 MG PO TABS
1.0000 | ORAL_TABLET | Freq: Once | ORAL | Status: AC
  Administered 2022-03-20: 1 via ORAL
  Filled 2022-03-20: qty 1

## 2022-03-20 NOTE — ED Provider Notes (Signed)
[facility] EMERGENCY DEPARTMENT Provider Note   CSN: [id number] Arrival date & time: 03/20/22  1502     History {Add pertinent medical, surgical, social history, OB history to HPI:1} Chief Complaint  Patient presents with   Brandi Ayala is a 36 y.o. female.   Fall       Home Medications Prior to Admission medications   Medication Sig Start Date End Date Taking? Authorizing Provider  methocarbamol (ROBAXIN) 500 MG tablet Take 1 tablet (500 mg total) by mouth 2 (two) times daily. 03/20/22  Yes Carlotta Telfair S, PA  albuterol (PROVENTIL HFA;VENTOLIN HFA) 108 (90 BASE) MCG/ACT inhaler Inhale 1 puff into the lungs every 6 (six) hours as needed for wheezing or shortness of breath.    [provider]  HYDROcodone-acetaminophen (NORCO/VICODIN) 5-325 MG tablet Take 1-2 tablets by mouth every 6 (six) hours as needed for severe pain. 03/06/22   Marita Kansas, PA-C  ibuprofen (ADVIL,MOTRIN) 600 MG tablet Take 1 tablet (600 mg total) by mouth every 6 (six) hours as needed. 03/03/14   Tracey Harries, MD  Multiple Vitamin (MULTIVITAMIN WITH MINERALS) TABS tablet Take 1 tablet by mouth daily.    [provider]  ondansetron (ZOFRAN-ODT) 4 MG disintegrating tablet Take 1 tablet (4 mg total) by mouth every 8 (eight) hours as needed. 03/06/22   Marita Kansas, PA-C      Allergies    Other and Sulfa antibiotics    Review of Systems   Review of Systems  Physical Exam Updated Vital Signs BP 101/76   Pulse 84   Temp (!) 97.5 F (36.4 C) (Oral)   Resp 18   Ht 5\' 6"  (1.676 m)   Wt 79.4 kg   LMP 02/24/2022 (Approximate)   SpO2 100%   BMI 28.25 kg/m  Physical Exam  ED Results / Procedures / Treatments   Labs (all labs ordered are listed, but only abnormal results are displayed) Labs Reviewed  PREGNANCY, URINE    EKG None  Radiology CT Lumbar Spine Wo Contrast  Result Date: 03/20/2022 CLINICAL DATA:  Trauma and low back pain EXAM: CT LUMBAR SPINE  WITHOUT CONTRAST TECHNIQUE: Multidetector CT imaging of the lumbar spine was performed without intravenous contrast administration. Multiplanar CT image reconstructions were also generated. RADIATION DOSE REDUCTION: This exam was performed according to the departmental dose-optimization program which includes automated exposure control, adjustment of the mA and/or kV according to patient size and/or use of iterative reconstruction technique. COMPARISON:  None Available. FINDINGS: Segmentation: 5 lumbar type vertebrae. Alignment: Normal. Vertebrae: There are minimally displaced right transverse process fractures at T3 and T4. The bones are otherwise normal. Paraspinal and other soft tissues: Negative. Disc levels: No spinal canal stenosis. IMPRESSION: Minimally displaced right transverse process fractures at T3 and T4. Electronically Signed   By: 14/06/2021 M.D.   On: 03/20/2022 20:57   DG Hip Unilat W or Wo Pelvis 2-3 Views Right  Result Date: 03/20/2022 CLINICAL DATA:  Status post fall last night with right hip pain. EXAM: DG HIP (WITH OR WITHOUT PELVIS) 2-3V RIGHT COMPARISON:  None Available. FINDINGS: There is no evidence of hip fracture or dislocation. There is no evidence of arthropathy or other focal bone abnormality. IMPRESSION: Negative. Electronically Signed   By: 14/06/2021 M.D.   On: 03/20/2022 15:52   DG Lumbar Spine Complete  Result Date: 03/20/2022 CLINICAL DATA:  Status post fall last night with back pain. EXAM: LUMBAR SPINE - COMPLETE 4+ VIEW COMPARISON:  None Available. FINDINGS: There is no evidence of lumbar spine fracture. Alignment is normal. Intervertebral disc spaces are maintained. IMPRESSION: Negative. Electronically Signed   By: Sherian Rein M.D.   On: 03/20/2022 15:51    Procedures Procedures  {Document cardiac monitor, telemetry assessment procedure when appropriate:1}  Medications Ordered in ED Medications  fentaNYL (SUBLIMAZE) injection 50 mcg (50 mcg Intravenous  Given 03/20/22 2243)  oxyCODONE-acetaminophen (PERCOCET/ROXICET) 5-325 MG per tablet 1 tablet (1 tablet Oral Given 03/20/22 2051)    ED Course/ Medical Decision Making/ A&P Clinical Course as of 03/20/22 2310  Sun Mar 20, 2022  2110 DG Hip Unilat W or Wo Pelvis 2-3 Views Right IMPRESSION: Negative.   [WF]  2110 CT Lumbar Spine Wo Contrast IMPRESSION: Minimally displaced right transverse process fractures at T3 and T4.   Electronically Signed   By: Deatra Robinson M.D.    [WF]    Clinical Course User Index [WF] Gailen Shelter, [place]                           Medical Decision Making Amount and/or Complexity of Data Reviewed Labs: ordered. Radiology: ordered. Decision-making details documented in ED Course.  Risk Prescription drug management.   ***  {Document critical care time when appropriate:1} {Document review of labs and clinical decision tools ie heart score, Chads2Vasc2 etc:1}  {Document your independent review of radiology images, and any outside records:1} {Document your discussion with family members, caretakers, and with consultants:1} {Document social determinants of health affecting pt's care:1} {Document your decision making why or why not admission, treatments were needed:1} Final Clinical Impression(s) / ED Diagnoses Final diagnoses:  Fall, initial encounter  Closed fracture of transverse process of thoracic vertebra, initial encounter (HCC)    Rx / DC Orders ED Discharge Orders          Ordered    methocarbamol (ROBAXIN) 500 MG tablet  2 times daily        03/20/22 2245

## 2022-03-20 NOTE — ED Triage Notes (Addendum)
Patient states she fell down her stairs on Thursday around 1 am and fell on her right side. Patient c/o right lower back and right hip pain. States unable to lift her right leg due to pain.

## 2022-03-20 NOTE — Discharge Instructions (Addendum)
Use muscle relaxer and Tylenol and ibuprofen as discussed below for pain  Please use Tylenol or ibuprofen for pain.  You may use 600 mg ibuprofen every 6 hours or 1000 mg of Tylenol every 6 hours.  You may choose to alternate between the 2.  This would be most effective.  Not to exceed 4 g of Tylenol within 24 hours.  Not to exceed 3200 mg ibuprofen 24 hours.

## 2022-03-20 NOTE — ED Provider Notes (Incomplete)
[facility] EMERGENCY DEPARTMENT Provider Note   CSN: [id number] Arrival date & time: 03/20/22  1502     History {Add pertinent medical, surgical, social history, OB history to HPI:1} Chief Complaint  Patient presents with  . Fall    Brandi Ayala is a 36 y.o. female.   Fall       Home Medications Prior to Admission medications   Medication Sig Start Date End Date Taking? Authorizing Provider  methocarbamol (ROBAXIN) 500 MG tablet Take 1 tablet (500 mg total) by mouth 2 (two) times daily. 03/20/22  Yes Tashawn Laswell S, PA  albuterol (PROVENTIL HFA;VENTOLIN HFA) 108 (90 BASE) MCG/ACT inhaler Inhale 1 puff into the lungs every 6 (six) hours as needed for wheezing or shortness of breath.    [provider]  HYDROcodone-acetaminophen (NORCO/VICODIN) 5-325 MG tablet Take 1-2 tablets by mouth every 6 (six) hours as needed for severe pain. 03/06/22   Marita Kansas, PA-C  ibuprofen (ADVIL,MOTRIN) 600 MG tablet Take 1 tablet (600 mg total) by mouth every 6 (six) hours as needed. 03/03/14   Tracey Harries, MD  Multiple Vitamin (MULTIVITAMIN WITH MINERALS) TABS tablet Take 1 tablet by mouth daily.    [provider]  ondansetron (ZOFRAN-ODT) 4 MG disintegrating tablet Take 1 tablet (4 mg total) by mouth every 8 (eight) hours as needed. 03/06/22   Marita Kansas, PA-C      Allergies    Other and Sulfa antibiotics    Review of Systems   Review of Systems  Physical Exam Updated Vital Signs BP 101/76   Pulse 84   Temp (!) 97.5 F (36.4 C) (Oral)   Resp 18   Ht 5\' 6"  (1.676 m)   Wt 79.4 kg   LMP 02/24/2022 (Approximate)   SpO2 100%   BMI 28.25 kg/m  Physical Exam  ED Results / Procedures / Treatments   Labs (all labs ordered are listed, but only abnormal results are displayed) Labs Reviewed  PREGNANCY, URINE    EKG None  Radiology CT Lumbar Spine Wo Contrast  Result Date: 03/20/2022 CLINICAL DATA:  Trauma and low back pain EXAM: CT LUMBAR  SPINE WITHOUT CONTRAST TECHNIQUE: Multidetector CT imaging of the lumbar spine was performed without intravenous contrast administration. Multiplanar CT image reconstructions were also generated. RADIATION DOSE REDUCTION: This exam was performed according to the departmental dose-optimization program which includes automated exposure control, adjustment of the mA and/or kV according to patient size and/or use of iterative reconstruction technique. COMPARISON:  None Available. FINDINGS: Segmentation: 5 lumbar type vertebrae. Alignment: Normal. Vertebrae: There are minimally displaced right transverse process fractures at T3 and T4. The bones are otherwise normal. Paraspinal and other soft tissues: Negative. Disc levels: No spinal canal stenosis. IMPRESSION: Minimally displaced right transverse process fractures at T3 and T4. Electronically Signed   By: 14/06/2021 M.D.   On: 03/20/2022 20:57   DG Hip Unilat W or Wo Pelvis 2-3 Views Right  Result Date: 03/20/2022 CLINICAL DATA:  Status post fall last night with right hip pain. EXAM: DG HIP (WITH OR WITHOUT PELVIS) 2-3V RIGHT COMPARISON:  None Available. FINDINGS: There is no evidence of hip fracture or dislocation. There is no evidence of arthropathy or other focal bone abnormality. IMPRESSION: Negative. Electronically Signed   By: 14/06/2021 M.D.   On: 03/20/2022 15:52   DG Lumbar Spine Complete  Result Date: 03/20/2022 CLINICAL DATA:  Status post fall last night with back pain. EXAM: LUMBAR SPINE - COMPLETE 4+ VIEW COMPARISON:  None Available. FINDINGS: There is no evidence of lumbar spine fracture. Alignment is normal. Intervertebral disc spaces are maintained. IMPRESSION: Negative. Electronically Signed   By: Sherian Rein M.D.   On: 03/20/2022 15:51    Procedures Procedures  {Document cardiac monitor, telemetry assessment procedure when appropriate:1}  Medications Ordered in ED Medications  fentaNYL (SUBLIMAZE) injection 50 mcg (50 mcg  Intravenous Given 03/20/22 2243)  oxyCODONE-acetaminophen (PERCOCET/ROXICET) 5-325 MG per tablet 1 tablet (1 tablet Oral Given 03/20/22 2051)    ED Course/ Medical Decision Making/ A&P Clinical Course as of 03/20/22 2310  Sun Mar 20, 2022  2110 DG Hip Unilat W or Wo Pelvis 2-3 Views Right IMPRESSION: Negative.   [WF]  2110 CT Lumbar Spine Wo Contrast IMPRESSION: Minimally displaced right transverse process fractures at T3 and T4.   Electronically Signed   By: Deatra Robinson M.D.    [WF]    Clinical Course User Index [WF] Gailen Shelter, [place]                           Medical Decision Making Amount and/or Complexity of Data Reviewed Labs: ordered. Radiology: ordered. Decision-making details documented in ED Course.  Risk Prescription drug management.   ***  {Document critical care time when appropriate:1} {Document review of labs and clinical decision tools ie heart score, Chads2Vasc2 etc:1}  {Document your independent review of radiology images, and any outside records:1} {Document your discussion with family members, caretakers, and with consultants:1} {Document social determinants of health affecting pt's care:1} {Document your decision making why or why not admission, treatments were needed:1} Final Clinical Impression(s) / ED Diagnoses Final diagnoses:  Fall, initial encounter  Closed fracture of transverse process of thoracic vertebra, initial encounter (HCC)    Rx / DC Orders ED Discharge Orders          Ordered    methocarbamol (ROBAXIN) 500 MG tablet  2 times daily        03/20/22 2245
